# Patient Record
Sex: Female | Born: 1990 | Race: Black or African American | Hispanic: No | Marital: Married | Smoking: Never smoker
Health system: Southern US, Community
[De-identification: ages and names within clinical notes are randomized; demographics above are authoritative.]

## PROBLEM LIST (undated history)

## (undated) DIAGNOSIS — M810 Age-related osteoporosis without current pathological fracture: Secondary | ICD-10-CM

## (undated) DIAGNOSIS — E785 Hyperlipidemia, unspecified: Secondary | ICD-10-CM

## (undated) DIAGNOSIS — J449 Chronic obstructive pulmonary disease, unspecified: Secondary | ICD-10-CM

## (undated) DIAGNOSIS — T7840XA Allergy, unspecified, initial encounter: Secondary | ICD-10-CM

## (undated) DIAGNOSIS — F32A Depression, unspecified: Secondary | ICD-10-CM

## (undated) DIAGNOSIS — Z789 Other specified health status: Secondary | ICD-10-CM

## (undated) DIAGNOSIS — F419 Anxiety disorder, unspecified: Secondary | ICD-10-CM

## (undated) DIAGNOSIS — J45909 Unspecified asthma, uncomplicated: Secondary | ICD-10-CM

## (undated) HISTORY — DX: Unspecified asthma, uncomplicated: J45.909

## (undated) HISTORY — DX: Allergy, unspecified, initial encounter: T78.40XA

## (undated) HISTORY — DX: Chronic obstructive pulmonary disease, unspecified: J44.9

## (undated) HISTORY — DX: Hyperlipidemia, unspecified: E78.5

## (undated) HISTORY — DX: Other specified health status: Z78.9

## (undated) HISTORY — DX: Depression, unspecified: F32.A

## (undated) HISTORY — PX: EYE SURGERY: SHX253

## (undated) HISTORY — PX: TUBAL LIGATION: SHX77

## (undated) HISTORY — DX: Age-related osteoporosis without current pathological fracture: M81.0

## (undated) HISTORY — DX: Anxiety disorder, unspecified: F41.9

---

## 2008-09-20 ENCOUNTER — Ambulatory Visit (HOSPITAL_COMMUNITY): Admission: RE | Admit: 2008-09-20 | Discharge: 2008-09-20 | Payer: Self-pay | Admitting: Obstetrics and Gynecology

## 2010-02-03 ENCOUNTER — Encounter: Payer: Self-pay | Admitting: Obstetrics and Gynecology

## 2018-04-13 HISTORY — PX: ENDOMETRIAL ABLATION: SHX621

## 2020-07-03 ENCOUNTER — Encounter: Payer: Self-pay | Admitting: Medical-Surgical

## 2020-07-03 ENCOUNTER — Ambulatory Visit (INDEPENDENT_AMBULATORY_CARE_PROVIDER_SITE_OTHER): Payer: Non-veteran care | Admitting: Medical-Surgical

## 2020-07-03 ENCOUNTER — Other Ambulatory Visit: Payer: Self-pay

## 2020-07-03 VITALS — BP 122/84 | HR 83 | Temp 99.6°F | Ht 62.5 in | Wt 232.0 lb

## 2020-07-03 DIAGNOSIS — Z1159 Encounter for screening for other viral diseases: Secondary | ICD-10-CM

## 2020-07-03 DIAGNOSIS — Z7689 Persons encountering health services in other specified circumstances: Secondary | ICD-10-CM

## 2020-07-03 DIAGNOSIS — Z114 Encounter for screening for human immunodeficiency virus [HIV]: Secondary | ICD-10-CM

## 2020-07-03 DIAGNOSIS — N644 Mastodynia: Secondary | ICD-10-CM

## 2020-07-03 DIAGNOSIS — Z1329 Encounter for screening for other suspected endocrine disorder: Secondary | ICD-10-CM

## 2020-07-03 DIAGNOSIS — M549 Dorsalgia, unspecified: Secondary | ICD-10-CM

## 2020-07-03 DIAGNOSIS — Z131 Encounter for screening for diabetes mellitus: Secondary | ICD-10-CM

## 2020-07-03 DIAGNOSIS — Z0000 Encounter for general adult medical examination without abnormal findings: Secondary | ICD-10-CM

## 2020-07-03 DIAGNOSIS — F419 Anxiety disorder, unspecified: Secondary | ICD-10-CM

## 2020-07-03 DIAGNOSIS — M542 Cervicalgia: Secondary | ICD-10-CM

## 2020-07-03 DIAGNOSIS — G8929 Other chronic pain: Secondary | ICD-10-CM

## 2020-07-03 MED ORDER — ESCITALOPRAM OXALATE 10 MG PO TABS: ORAL_TABLET | ORAL | 1 refills | Status: DC

## 2020-07-03 NOTE — Progress Notes (Signed)
New Patient Office Visit  Subjective:  Patient ID: Jamie Manning, female    DOB: 08/18/1990  Age: 30 y.o. MRN: [id number]  CC:  Chief Complaint  Patient presents with   Establish Care    HPI Jamie Manning presents to establish care.  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  Anxiety-notes that she does have significant anxiety that gets in the way of completing her everyday activities.  A lot of times, her anxiety revolves around her job.  Most notable trigger involves deviation from plans that she has made.  When unexpected issues do arise, she has difficulty tolerating the change in plan and often ends up crying out her frustration.  She has never been to counseling and has never tried medication before but she is open to both.  Denies SI/HI.  Breast pain-she has dealt with breast pain for several years and notes that her breasts have always been large and very heavy.  She has difficulty finding bras that fit despite several attempts at being sized.  She would like to speak with someone in plastic surgery regarding getting a breast reduction to help reduce her back and neck pain.  Back/neck pain-she does have quite a bit of back and neck pain that is chronic in nature.  Some of this she relates to being a large breasted female.  Other parts may be relatable to having a sedentary job where she sits at a computer all day.  She has considered getting a stand-up desk but has not pursued that yet.  Past Medical History:  Diagnosis Date   No pertinent past medical history     Past Surgical History:  Procedure Laterality Date   TUBAL LIGATION      History reviewed. No pertinent family history.  Social History   Socioeconomic History   Marital status: Married    Spouse name: Not on file   Number of children: Not on file   Years of education: Not on file   Highest education level: Not on file  Occupational History   Not on file  Tobacco Use   Smoking  status: Never   Smokeless tobacco: Never  Vaping Use   Vaping Use: Not on file  Substance and Sexual Activity   Alcohol use: Never   Drug use: Never   Sexual activity: Yes    Birth control/protection: Surgical    Comment: Tubal ligation  Other Topics Concern   Not on file  Social History Narrative   Not on file   Social Determinants of Health   Financial Resource Strain: Not on file  Food Insecurity: Not on file  Transportation Needs: Not on file  Physical Activity: Not on file  Stress: Not on file  Social Connections: Not on file  Intimate Partner Violence: Not on file    ROS Review of Systems  Constitutional:  Negative for chills, fatigue, fever and unexpected weight change.  Respiratory:  Negative for cough, chest tightness, shortness of breath and wheezing.   Cardiovascular:  Negative for chest pain, palpitations and leg swelling.  Gastrointestinal:  Negative for abdominal pain, constipation, diarrhea, nausea and vomiting.  Musculoskeletal:  Positive for back pain and neck pain.  Neurological:  Negative for dizziness, light-headedness and headaches.  Psychiatric/Behavioral:  Negative for dysphoric mood, self-injury, sleep disturbance and suicidal ideas. The patient is nervous/anxious.    Objective:   Today's Vitals: BP 122/84   Pulse 83   Temp 99.6 F (37.6 C)   Ht 5'  2.5" (1.588 m)   Wt 232 lb (105.2 kg)   LMP 06/28/2020   SpO2 98%   BMI 41.76 kg/m   Physical Exam Vitals reviewed.  Constitutional:      General: She is not in acute distress.    Appearance: Normal appearance.  HENT:     Head: Normocephalic and atraumatic.  Cardiovascular:     Rate and Rhythm: Normal rate and regular rhythm.     Pulses: Normal pulses.     Heart sounds: Normal heart sounds. No murmur heard.   No friction rub. No gallop.  Pulmonary:     Effort: Pulmonary effort is normal. No respiratory distress.     Breath sounds: Normal breath sounds. No wheezing.  Skin:    General:  Skin is warm and dry.  Neurological:     Mental Status: She is alert and oriented to person, place, and time.  Psychiatric:        Mood and Affect: Mood normal.        Behavior: Behavior normal.        Thought Content: Thought content normal.        Judgment: Judgment normal.    Assessment & Plan:   1. Encounter to establish care Reviewed available information and discussed healthcare concerns with patient.  2. Need for hepatitis C screening test 3. Screening for HIV (human immunodeficiency virus) Discuss screening recommendations.  Patient is agreeable so adding to blood work today. - Hepatitis C antibody - HIV Antibody (routine testing w rflx)  4. Neck pain 5. Chronic bilateral back pain, unspecified back locationRecommend getting a stand-up desk.  Also reviewed good body mechanics and recommended physical therapy stretches to do to maintain good posture as well as help resolve current back and neck pain.  Okay to use over-the-counter medications such as Tylenol and ibuprofen to help if needed.  6. Preventative health care Checking CBC with differential, CMP, and lipid panel today. - CBC with Differential/Platelet - COMPLETE METABOLIC PANEL WITH GFR - Lipid panel  7. Thyroid disorder screen Checking TSH. - TSH  8. Diabetes mellitus screening Checking hemoglobin A1c.  9. Breast pain Referring to plastic surgery - Ambulatory referral to Plastic Surgery  10. Anxiety Discussed treatment options.  Starting Lexapro 5 mg daily with increase to 10 mg daily after 8 days.  Referring to behavioral health for counseling. - Ambulatory referral to behavioral health    Outpatient Encounter Medications as of 07/03/2020  Medication Sig   Multiple Vitamin tablet Take 1 tablet by mouth daily.   No facility-administered encounter medications on file as of 07/03/2020.    Follow-up: Return in about 4 weeks (around 07/31/2020) for mood follow up.   Thayer Ohm, DNP, APRN,  FNP-BC [facility]

## 2020-07-03 NOTE — Patient Instructions (Signed)
Reasons why it is important to allow yourself to process and experience true feelings: When you numb sadness, you also numb happiness and Jamillia Closson. Struggling with your emotions often leads to more suffering. Processing and experiencing your feelings is a part of having a full life.    Coping skills are things we can do to make ourselves feel better when we are going through difficult times. Examples of coping skills include:  Take a deep breath Count to 20 Listen to music Call a friend Take a walk Read a book Do a puzzle Meditate Journal Exercise Stretch Sing Bake Knit Go outside Garden Pray Color Send a note Take a bath Watch a movie Pet an animal Visit a friend Be alone in a quiet place   When your anxiety gets worse, try these grounding techniques:      If you need additional help, please contact your primary care provider or call one of the resources listed below:  [facility]  24-hour HelpLine at 336-832-9700 or 800-711-2635 [address] [place], [place] [address]  National Hopeline Network 800-SUICIDE or 800-784-2433  The National Suicide Prevention Lifeline 800-273-TALK or 800-273-8255  [facility] https://www.celebraterecovery.com/   

## 2020-07-04 LAB — COMPLETE METABOLIC PANEL WITH GFR
AG Ratio: 1.5 (calc) (ref 1.0–2.5)
ALT: 12 U/L (ref 6–29)
AST: 15 U/L (ref 10–30)
Albumin: 4.3 g/dL (ref 3.6–5.1)
Alkaline phosphatase (APISO): 85 U/L (ref 31–125)
BUN/Creatinine Ratio: 10 (calc) (ref 6–22)
BUN: 6 mg/dL — ABNORMAL LOW (ref 7–25)
CO2: 26 mmol/L (ref 20–32)
Calcium: 9.3 mg/dL (ref 8.6–10.2)
Chloride: 104 mmol/L (ref 98–110)
Creat: 0.61 mg/dL (ref 0.50–1.10)
GFR, Est African American: 140 mL/min/{1.73_m2} (ref >=60)
GFR, Est Non African American: 121 mL/min/{1.73_m2} (ref >=60)
Globulin: 2.8 g/dL (calc) (ref 1.9–3.7)
Glucose, Bld: 81 mg/dL (ref 65–99)
Potassium: 4.1 mmol/L (ref 3.5–5.3)
Sodium: 139 mmol/L (ref 135–146)
Total Bilirubin: 0.5 mg/dL (ref 0.2–1.2)
Total Protein: 7.1 g/dL (ref 6.1–8.1)

## 2020-07-04 LAB — CBC WITH DIFFERENTIAL/PLATELET
Absolute Monocytes: 454 cells/uL (ref 200–950)
Basophils Absolute: 46 cells/uL (ref 0–200)
Basophils Relative: 0.6 %
Eosinophils Absolute: 208 cells/uL (ref 15–500)
Eosinophils Relative: 2.7 %
HCT: 40.4 % (ref 35.0–45.0)
Hemoglobin: 13.5 g/dL (ref 11.7–15.5)
Lymphs Abs: 2495 cells/uL (ref 850–3900)
MCH: 28.7 pg (ref 27.0–33.0)
MCHC: 33.4 g/dL (ref 32.0–36.0)
MCV: 86 fL (ref 80.0–100.0)
MPV: 10.1 fL (ref 7.5–12.5)
Monocytes Relative: 5.9 %
Neutro Abs: 4497 cells/uL (ref 1500–7800)
Neutrophils Relative %: 58.4 %
Platelets: 329 10*3/uL (ref 140–400)
RBC: 4.7 10*6/uL (ref 3.80–5.10)
RDW: 13.1 % (ref 11.0–15.0)
Total Lymphocyte: 32.4 %
WBC: 7.7 10*3/uL (ref 3.8–10.8)

## 2020-07-04 LAB — LIPID PANEL
Cholesterol: 224 mg/dL — ABNORMAL HIGH (ref <200)
HDL: 52 mg/dL (ref >=50)
LDL Cholesterol (Calc): 151 mg/dL (calc) — ABNORMAL HIGH
Non-HDL Cholesterol (Calc): 172 mg/dL (calc) — ABNORMAL HIGH (ref <130)
Total CHOL/HDL Ratio: 4.3 (calc) (ref <5.0)
Triglycerides: 97 mg/dL (ref <150)

## 2020-07-04 LAB — HEPATITIS C ANTIBODY
Hepatitis C Ab: NONREACTIVE
SIGNAL TO CUT-OFF: 0.06 (ref <1.00)

## 2020-07-04 LAB — HIV ANTIBODY (ROUTINE TESTING W REFLEX): HIV 1&2 Ab, 4th Generation: NONREACTIVE

## 2020-07-04 LAB — TSH: TSH: 0.78 mIU/L

## 2020-07-16 ENCOUNTER — Encounter: Payer: Self-pay | Admitting: Medical-Surgical

## 2020-07-17 MED ORDER — ESCITALOPRAM OXALATE 10 MG PO TABS: ORAL_TABLET | ORAL | 1 refills | Status: DC

## 2020-07-31 ENCOUNTER — Ambulatory Visit: Payer: Self-pay | Admitting: Medical-Surgical

## 2020-07-31 DIAGNOSIS — F419 Anxiety disorder, unspecified: Secondary | ICD-10-CM

## 2020-09-06 ENCOUNTER — Encounter: Payer: Self-pay | Admitting: Medical-Surgical

## 2020-09-06 ENCOUNTER — Telehealth (INDEPENDENT_AMBULATORY_CARE_PROVIDER_SITE_OTHER): Payer: BC Managed Care – PPO | Admitting: Medical-Surgical

## 2020-09-06 DIAGNOSIS — F419 Anxiety disorder, unspecified: Secondary | ICD-10-CM

## 2020-09-06 DIAGNOSIS — K644 Residual hemorrhoidal skin tags: Secondary | ICD-10-CM | POA: Diagnosis not present

## 2020-09-06 DIAGNOSIS — K648 Other hemorrhoids: Secondary | ICD-10-CM | POA: Diagnosis not present

## 2020-09-06 MED ORDER — HYDROCORTISONE (PERIANAL) 2.5 % EX CREA: 1.0000 "application " | TOPICAL_CREAM | Freq: Two times a day (BID) | CUTANEOUS | 0 refills | Status: DC

## 2020-09-06 MED ORDER — HYDROCORTISONE ACETATE 25 MG RE SUPP: 25.0000 mg | Freq: Two times a day (BID) | RECTAL | 2 refills | Status: DC | PRN

## 2020-09-06 NOTE — Progress Notes (Signed)
Virtual Visit via Video Note  I connected with Jamie Manning on 09/06/20 at  3:00 PM EDT by a video enabled telemedicine application and verified that I am speaking with the correct person using two identifiers.   I discussed the limitations of evaluation and management by telemedicine and the availability of in person appointments. The patient expressed understanding and agreed to proceed.  Patient location: home Provider locations: office  Subjective:    CC: Mood follow-up  HPI: Pleasant 30 year old female presenting via MyChart video visit for mood follow-up.  In early July, we started on Lexapro 10 mg daily.  She has been taking the medication daily as prescribed, tolerating well for the most part.  She did have some severe nausea during the first couple of weeks but this is resolved with the exception of intermittent mild nausea.  She notes this side effect is very manageable.  She does note some improvement in her mood although she feels like the medication could work slightly better.  Most of her concern revolves around her ability to sleep at night.  She has never taken medications for sleep outside of melatonin one time several years ago.  Denies SI/HI.  Has a history of hemorrhoids and notes that she since she has been working from home and sitting more often, she is having difficulty with hemorrhoid flares.  She has external hemorrhoids and possibly internal hemorrhoids as well.  Has difficulty with this every day.  Notes that her stools are a bit hard to pass and require straining at times.  This can be kind of painful.  She has not seen any bright red blood or black tarry stools.  She has used Tucks pads which do help relieve the irritation but do not address the actual hemorrhoids.  Past medical history, Surgical history, Family history not pertinant except as noted below, Social history, Allergies, and medications have been entered into the medical record, reviewed, and corrections  made.   Review of Systems: See HPI for pertinent positives and negatives.   Depression screen [facility] 2/9 09/06/2020 07/03/2020  Decreased Interest 0 2  Down, Depressed, Hopeless 1 2  PHQ - 2 Score 1 4  Altered sleeping 1 1  Tired, decreased energy 1 1  Change in appetite 1 1  Feeling bad or failure about yourself  0 2  Trouble concentrating 1 2  Moving slowly or fidgety/restless 0 1  Suicidal thoughts 0 0  PHQ-9 Score 5 12  Difficult doing work/chores Somewhat difficult Somewhat difficult   GAD 7 : Generalized Anxiety Score 09/06/2020 07/03/2020  Nervous, Anxious, on Edge 1 3  Control/stop worrying 0 2  Worry too much - different things 1 2  Trouble relaxing 1 2  Restless 1 2  Easily annoyed or irritable 1 1  Afraid - awful might happen 0 0  Total GAD 7 Score 5 12  Anxiety Difficulty Somewhat difficult Somewhat difficult   Objective:    General: Speaking clearly in complete sentences without any shortness of breath.  Alert and oriented x3.  Normal judgment. No apparent acute distress.  Impression and Recommendations:    1. Anxiety After reviewing various options including continue the medication, add a medication to it, or switch to a different medication, she would like to continue her current dose of Lexapro 10 mg daily.  Discussed over-the-counter and herbal options for helping with anxiety and sleep.  Recommend ashwaghanda, kava, valerian root, chamomile, or sleepy time tea to help with anxiety/sleep at bedtime.  She  will reach out to me should these alternatives not work and she decides she would like to try different medicine.  2. Internal and external hemorrhoids without complication Sending in Anusol suppositories to use for internal irritation.  Also sending in Anusol cream to use for external.  Discussed at length treatments available for constipation.  Recommend adding in a fiber supplement as well as using MiraLAX daily as needed.  I discussed the assessment and  treatment plan with the patient. The patient was provided an opportunity to ask questions and all were answered. The patient agreed with the plan and demonstrated an understanding of the instructions.   The patient was advised to call back or seek an in-person evaluation if the symptoms worsen or if the condition fails to improve as anticipated.  20 minutes of non-face-to-face time was provided during this encounter.  Return in about 3 months (around 12/07/2020) for mood follow up.  Thayer Ohm, DNP, APRN, FNP-BC [facility] Medicine

## 2020-10-10 ENCOUNTER — Institutional Professional Consult (permissible substitution): Payer: Self-pay | Admitting: Plastic Surgery

## 2021-01-30 ENCOUNTER — Institutional Professional Consult (permissible substitution): Payer: Non-veteran care | Admitting: Plastic Surgery

## 2021-01-31 ENCOUNTER — Telehealth: Payer: Self-pay | Admitting: General Practice

## 2021-01-31 NOTE — Telephone Encounter (Signed)
Transition Care Management Follow-up Telephone Call Date of discharge and from where: 01/31/21 from [facility] How have you been since you were released from the hospital? Still having a lot of hip pain. Any questions or concerns? No  Items Reviewed: Did the pt receive and understand the discharge instructions provided? Yes  Medications obtained and verified? Yes  Other? No  Any new allergies since your discharge? No  Dietary orders reviewed? Yes Do you have support at home? Yes   Home Care and Equipment/Supplies: Were home health services ordered? no  Functional Questionnaire: (I = Independent and D = Dependent) ADLs: I  Bathing/Dressing- I  Meal Prep- I  Eating- I  Maintaining continence- I  Transferring/Ambulation- I  Managing Meds- I  Follow up appointments reviewed:  PCP Hospital f/u appt confirmed? Yes  Scheduled to see Christen Butter, NP on 02/05/21 @ 1600. Specialist Hospital f/u appt confirmed? No   Are transportation arrangements needed? No  If their condition worsens, is the pt aware to call PCP or go to the Emergency Dept.? Yes Was the patient provided with contact information for the PCP's office or ED? Yes Was to pt encouraged to call back with questions or concerns? Yes

## 2021-02-03 ENCOUNTER — Ambulatory Visit: Payer: Non-veteran care | Admitting: Medical-Surgical

## 2021-02-05 ENCOUNTER — Ambulatory Visit (INDEPENDENT_AMBULATORY_CARE_PROVIDER_SITE_OTHER): Payer: Self-pay | Admitting: Medical-Surgical

## 2021-02-05 ENCOUNTER — Other Ambulatory Visit: Payer: Self-pay

## 2021-02-05 ENCOUNTER — Encounter: Payer: Self-pay | Admitting: Medical-Surgical

## 2021-02-05 VITALS — BP 126/85 | HR 79 | Resp 20 | Ht 62.5 in | Wt 226.0 lb

## 2021-02-05 DIAGNOSIS — T148XXA Other injury of unspecified body region, initial encounter: Secondary | ICD-10-CM

## 2021-02-05 DIAGNOSIS — Z09 Encounter for follow-up examination after completed treatment for conditions other than malignant neoplasm: Secondary | ICD-10-CM
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
## 2021-02-07 ENCOUNTER — Institutional Professional Consult (permissible substitution): Payer: Non-veteran care | Admitting: Plastic Surgery

## 2021-02-16 ENCOUNTER — Encounter: Payer: Self-pay | Admitting: Medical-Surgical

## 2021-02-19 ENCOUNTER — Ambulatory Visit: Payer: Non-veteran care | Admitting: Medical-Surgical

## 2021-02-19 ENCOUNTER — Ambulatory Visit: Payer: BC Managed Care – PPO | Attending: Medical-Surgical | Admitting: Physical Therapy

## 2021-02-19 ENCOUNTER — Other Ambulatory Visit: Payer: Self-pay

## 2021-02-19 ENCOUNTER — Encounter: Payer: Self-pay | Admitting: Physical Therapy

## 2021-02-19 DIAGNOSIS — R29898 Other symptoms and signs involving the musculoskeletal system: Secondary | ICD-10-CM

## 2021-02-19 DIAGNOSIS — M791 Myalgia, unspecified site: Secondary | ICD-10-CM

## 2021-02-19 DIAGNOSIS — M6281 Muscle weakness (generalized): Secondary | ICD-10-CM | POA: Diagnosis present

## 2021-02-19 DIAGNOSIS — T148XXA Other injury of unspecified body region, initial encounter: Secondary | ICD-10-CM | POA: Diagnosis not present
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
## 2021-02-19 NOTE — Patient Instructions (Signed)
Access Code: [email or web address] URL: [email or web address] Date: 02/19/2021 Prepared by: Reggy Eye  Exercises Supine Lower Trunk Rotation - 1 x daily - 7 x weekly - 3 sets - 10 reps Supine Transversus Abdominis Bracing - Hands on Stomach - 1 x daily - 7 x weekly - 1 sets - 10 reps - 5 seconds hold Bent Knee Fallouts - 1 x daily - 7 x weekly - 2 sets - 10 reps Seated Scapular Retraction - 1 x daily - 7 x weekly - 3 sets - 10 reps Shoulder External Rotation and Scapular Retraction with Resistance - 1 x daily - 7 x weekly - 3 sets - 10 reps

## 2021-02-24 ENCOUNTER — Ambulatory Visit (INDEPENDENT_AMBULATORY_CARE_PROVIDER_SITE_OTHER): Payer: BC Managed Care – PPO | Admitting: Plastic Surgery

## 2021-02-24 ENCOUNTER — Other Ambulatory Visit: Payer: Self-pay

## 2021-02-24 ENCOUNTER — Encounter: Payer: Self-pay | Admitting: Plastic Surgery

## 2021-02-24 VITALS — BP 122/83 | HR 81 | Ht 61.0 in | Wt 228.4 lb

## 2021-02-24 DIAGNOSIS — M793 Panniculitis, unspecified: Secondary | ICD-10-CM | POA: Diagnosis not present

## 2021-02-24 DIAGNOSIS — G8929 Other chronic pain: Secondary | ICD-10-CM | POA: Diagnosis not present

## 2021-02-24 DIAGNOSIS — N62 Hypertrophy of breast: Secondary | ICD-10-CM | POA: Diagnosis not present

## 2021-02-24 DIAGNOSIS — M546 Pain in thoracic spine: Secondary | ICD-10-CM | POA: Diagnosis not present

## 2021-02-24 NOTE — Progress Notes (Signed)
Referring Provider Christen Butter, NP [address] [place],  [place] [address]   CC:  Breast hypertrophy, abdominal rashes  Jamie Manning is an 31 y.o. female.  HPI: Mammary Hyperplasia: The patient is a 31 y.o. female with a history of mammary hyperplasia for several years.  She has extremely large breasts causing symptoms that include the following: Back pain in the upper and lower back, including neck pain. She pulls or pins her bra straps to provide better lift and relief of the pressure and pain. She notices relief by holding her breast up manually.  Her shoulder straps cause grooves and pain and pressure that requires padding for relief. Pain medication is sometimes required with motrin and tylenol.  Activities that are hindered by enlarged breasts include: exercise and running.  She has tried supportive clothing as well as fitted bras without improvement.  Her breasts are extremely large and fairly symmetric with the left slightly larger.  She has hyperpigmentation of the inframammary area on both sides.    Preoperative bra size = 42 DDD cup.  Mammogram history: None due to age.  Family history of breast cancer: Paternal grandmother.  Tobacco use: None.   The patient expresses the desire to pursue surgical intervention.   Patient is also interested in abdominal panniculectomy.  She has been using powders and deodorant and other over-the-counter medication for treatment of her rashes.  Of note she had a C-section in 2019 and a tubal ligation in 2019.  Allergies  Allergen Reactions   Penicillins Anaphylaxis, Shortness Of Breath, Itching, Swelling and Cough    Outpatient Encounter Medications as of 02/24/2021  Medication Sig   escitalopram (LEXAPRO) 10 MG tablet Take 5mg  daily for 8 days then  take 10mg  daily.   Multiple Vitamin tablet Take 1 tablet by mouth daily.   [DISCONTINUED] hydrocortisone (ANUSOL-HC) 2.5 % rectal cream Place 1 application rectally 2 (two) times  daily. (Patient not taking: Reported on 02/24/2021)   [DISCONTINUED] hydrocortisone (ANUSOL-HC) 25 MG suppository Place 1 suppository (25 mg total) rectally 2 (two) times daily as needed for hemorrhoids. (Patient not taking: Reported on 02/24/2021)   No facility-administered encounter medications on file as of 02/24/2021.     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18 40.65 -  Systolic 122 126 (No Data)  Diastolic 83 85 (No Data)  Pulse 81 79 -    General:  No acute distress,  Alert and oriented, Non-Toxic,  Normal speech and affect Breast: No masses palpable on physical exam, significant breast ptosis, macromastia the sternal to nipple distance on the right is 33 cm and the left is 35 cm.  The IMF distance is 14 on the right and 15 on the left cm.   Abdomen: Significant skin and adipose excess above and below the umbilicus.  Low intra-abdominal fat  Assessment/Plan  Breast: The patient has bilateral symptomatic macromastia.  She is a good candidate for a breast reduction.  She is interested in pursuing surgical treatment.  She has tried supportive garments and fitted  bras with no relief.  The details of breast reduction surgery were discussed.  I explained the procedure in detail along the with the expected scars.  The risks were discussed in detail and include bleeding, infection, damage to surrounding structures, need for additional procedures, nipple loss, change in nipple sensation, persistent pain, contour irregularities and asymmetries.  I explained that breast feeding is often not possible after breast reduction surgery.  We discussed the expected postoperative course with an overall recovery period of about 1 month.  She demonstrated full understanding of all risks.  We discussed her personal risk factors that include high BMI.  The patient is interested in pursuing surgical treatment.  I anticipate approximately 750 to 800 g of tissue removed from each side.   Abdomen: The patient is a candidate for abdominal panniculectomy which may improve her rashes.  And then her best result would be to lose additional weight and we could consider full abdominoplasty at that time.  She will get maximal benefit since she does have excess above and below her umbilicus.  [name] 02/24/2021, 3:44 PM

## 2021-02-25 ENCOUNTER — Ambulatory Visit: Payer: BC Managed Care – PPO | Admitting: Physical Therapy

## 2021-02-28 ENCOUNTER — Ambulatory Visit: Payer: BC Managed Care – PPO | Admitting: Physical Therapy

## 2021-02-28 ENCOUNTER — Other Ambulatory Visit: Payer: Self-pay

## 2021-02-28 DIAGNOSIS — M6281 Muscle weakness (generalized): Secondary | ICD-10-CM | POA: Diagnosis not present

## 2021-02-28 DIAGNOSIS — R29898 Other symptoms and signs involving the musculoskeletal system: Secondary | ICD-10-CM

## 2021-02-28 DIAGNOSIS — M791 Myalgia, unspecified site: Secondary | ICD-10-CM

## 2021-02-28 NOTE — Therapy (Signed)
[facility] [address] [place], [place], [address] Phone: [phone]   Fax:  [phone]  Physical Therapy Treatment  Patient Details  Name: Jamie Manning MRN: [id number] Date of Birth: 08/24/1990 Referring Provider (PT): Christen Butter   Encounter Date: 02/28/2021   PT End of Session - 02/28/21 0935     Visit Number 2    Number of Visits 12    Date for PT Re-Evaluation 04/02/21    PT Start Time 0851    PT Stop Time 0930    PT Time Calculation (min) 39 min    Activity Tolerance Patient tolerated treatment well    Behavior During Therapy [facility] for tasks assessed/performed             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   Subjective Assessment - 02/28/21 0856     Subjective Pt states her back and shoulder are still sore. She feels her shoulder flairs up when she lifts.    Currently in Pain? Yes    Pain Score 5     Pain Location Shoulder    Pain Orientation Right    Pain Descriptors / Indicators Aching;Sore    Pain Type Acute pain                               [facility] Adult PT Treatment/Exercise - 02/28/21 0001       Lumbar Exercises: Aerobic   Nustep L5 x 5 min for warm up      Lumbar Exercises: Supine   Ab Set 10 reps;5 seconds    AB Set Limitations then ab set with bent knee fall out x 10    Other Supine Lumbar Exercises lower trunk rotation x 10 bilat      Shoulder Exercises: Seated   Retraction Limitations bilat retraction x 10 red TB    Other Seated Exercises scap squeeze x 10 cues for posture      Shoulder Exercises:  Standing   Extension 15 reps    Theraband Level (Shoulder Extension) Level 2 (Red)    Row 15 reps    Theraband Level (Shoulder Row) Level 2 (Red)    Other Standing Exercises bow and arrow red TB x 10 bilat      Manual Therapy   Manual Therapy Joint mobilization;Soft tissue mobilization    Joint Mobilization SIJ CPAs and UPAs grade 2-3    Soft tissue mobilization STM Rt glue and piriformis                          PT Long Term Goals - 02/19/21 1119       PT LONG TERM GOAL #1   Title Pt will be independent in HEP    Time 6    Period Weeks    Status New    Target Date 04/02/21      PT LONG TERM GOAL #2   Title Pt will improve  FOTO to >= 73 to demo improved functional mobility    Time 6    Period Weeks    Status New    Target Date 04/02/21      PT LONG TERM GOAL #3   Title Pt will improve bilat LE strength to 4+/5 to tolerate walking x 20 minutes with decreased pain    Time 6    Period Weeks    Status New    Target Date 04/02/21      PT LONG TERM GOAL #4   Title Pt will tolerate carrying in groceries with Rt UE with pain <= 1/10    Time 6    Period Weeks    Status New    Target Date 04/02/21                   Plan - 02/28/21 0935     Clinical Impression Statement Pt continues with Rt sided back and Rt shoulder pain. Good tolerance to progression of exercises. Able to decrease pain by end of session with manual work    PT Next Visit Plan update HEP, progress core and postural strength    PT Home Exercise Plan [email or web address]    Consulted and Agree with Plan of Care Patient             Patient will benefit from skilled therapeutic intervention in order to improve the following deficits and impairments:     Visit Diagnosis: Muscle weakness (generalized)  Myalgia  Other symptoms and signs involving the musculoskeletal system     Problem List There are no problems to display for this patient.   Tashay Bozich, PT 02/28/2021, 9:36  AM  [facility] [address] [place], [place], [address] Phone: [phone]   Fax:  [phone]  Name: Jamie Manning MRN: [id number] Date of Birth: 09/06/1990

## 2021-03-04 ENCOUNTER — Encounter: Payer: Self-pay | Admitting: Physical Therapy

## 2021-03-07 ENCOUNTER — Other Ambulatory Visit: Payer: Self-pay

## 2021-03-07 ENCOUNTER — Ambulatory Visit: Payer: BC Managed Care – PPO | Admitting: Physical Therapy

## 2021-03-07 DIAGNOSIS — M6281 Muscle weakness (generalized): Secondary | ICD-10-CM

## 2021-03-07 DIAGNOSIS — M791 Myalgia, unspecified site: Secondary | ICD-10-CM

## 2021-03-07 DIAGNOSIS — R29898 Other symptoms and signs involving the musculoskeletal system: Secondary | ICD-10-CM
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
## 2021-03-12 ENCOUNTER — Ambulatory Visit: Payer: BC Managed Care – PPO | Admitting: Physical Therapy

## 2021-03-12 ENCOUNTER — Telehealth: Payer: Self-pay | Admitting: Medical-Surgical

## 2021-03-12 NOTE — Telephone Encounter (Signed)
Short term disability forms received. Patient message noted saying she will be out a few more weeks. She was originally written out of work until 03/11/2021 with intermittent absences after that to attend appointments and such. If she is needing a few more weeks, this requires a follow up appointment to discuss the appropriateness of extending her time out. I have to be able to justify keeping her out of work as there are often accommodations that can be made. Please contact patient regarding this and schedule her for a work in appointment so we can clarify this.  ? ?___________________________________________ ?Thayer Ohm, DNP, APRN, FNP-BC ?Primary Care and Sports Medicine ?[facility] ? ?

## 2021-03-12 NOTE — Telephone Encounter (Signed)
Left message advising of recommendations.  

## 2021-03-13 ENCOUNTER — Other Ambulatory Visit: Payer: Self-pay

## 2021-03-13 ENCOUNTER — Ambulatory Visit: Payer: BC Managed Care – PPO | Attending: Medical-Surgical | Admitting: Physical Therapy

## 2021-03-13 DIAGNOSIS — R29898 Other symptoms and signs involving the musculoskeletal system: Secondary | ICD-10-CM | POA: Insufficient documentation

## 2021-03-13 DIAGNOSIS — M791 Myalgia, unspecified site: Secondary | ICD-10-CM | POA: Insufficient documentation

## 2021-03-13 DIAGNOSIS — M6281 Muscle weakness (generalized): Secondary | ICD-10-CM | POA: Diagnosis present
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
## 2021-03-19 ENCOUNTER — Other Ambulatory Visit: Payer: Self-pay

## 2021-03-19 ENCOUNTER — Ambulatory Visit: Payer: BC Managed Care – PPO | Admitting: Physical Therapy

## 2021-03-19 ENCOUNTER — Telehealth: Payer: Self-pay | Admitting: Medical-Surgical

## 2021-03-19 DIAGNOSIS — M6281 Muscle weakness (generalized): Secondary | ICD-10-CM | POA: Diagnosis not present

## 2021-03-19 DIAGNOSIS — R29898 Other symptoms and signs involving the musculoskeletal system: Secondary | ICD-10-CM

## 2021-03-19 DIAGNOSIS — M791 Myalgia, unspecified site: Secondary | ICD-10-CM

## 2021-03-19 NOTE — Telephone Encounter (Signed)
Patient dropped off Short Term Disability forms for PCP ([place]) to complete. Paperwork placed in providers box. Patient was informed of a possible fee and a 3-5 day turnaround. - lmr. ?

## 2021-03-19 NOTE — Telephone Encounter (Signed)
Thank you :)

## 2021-03-19 NOTE — Telephone Encounter (Signed)
Yes ma'am. If that is what needs to be done.
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
## 2021-03-19 NOTE — Telephone Encounter (Signed)
Joy, ?Would you allow me to schedule her in a same day spot? I know your schedule is packed and I noticed your vacation is coming up real soon! ?

## 2021-03-19 NOTE — Telephone Encounter (Signed)
I left patient a v.mail to return my call in regards to scheduling a visit to review paperwork. ?

## 2021-03-20 NOTE — Telephone Encounter (Signed)
Patient has been scheduled for 03/24/2021. ?

## 2021-03-24 ENCOUNTER — Encounter: Payer: Self-pay | Admitting: Medical-Surgical

## 2021-03-24 ENCOUNTER — Ambulatory Visit (INDEPENDENT_AMBULATORY_CARE_PROVIDER_SITE_OTHER): Payer: BC Managed Care – PPO | Admitting: Medical-Surgical

## 2021-03-24 ENCOUNTER — Other Ambulatory Visit: Payer: Self-pay

## 2021-03-24 ENCOUNTER — Ambulatory Visit (INDEPENDENT_AMBULATORY_CARE_PROVIDER_SITE_OTHER): Payer: BC Managed Care – PPO

## 2021-03-24 VITALS — BP 128/84 | HR 79 | Resp 20 | Ht 61.0 in | Wt 236.5 lb

## 2021-03-24 DIAGNOSIS — M542 Cervicalgia: Secondary | ICD-10-CM

## 2021-03-24 DIAGNOSIS — Z0289 Encounter for other administrative examinations: Secondary | ICD-10-CM

## 2021-03-24 DIAGNOSIS — R29898 Other symptoms and signs involving the musculoskeletal system: Secondary | ICD-10-CM | POA: Diagnosis not present

## 2021-03-24 DIAGNOSIS — R1011 Right upper quadrant pain: Secondary | ICD-10-CM

## 2021-03-24 DIAGNOSIS — R42 Dizziness and giddiness: Secondary | ICD-10-CM

## 2021-03-24 DIAGNOSIS — R202 Paresthesia of skin: Secondary | ICD-10-CM | POA: Diagnosis not present
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
## 2021-03-25 LAB — COMPLETE METABOLIC PANEL WITH GFR
AG Ratio: 1.4 (calc) (ref 1.0–2.5)
ALT: 11 U/L (ref 6–29)
AST: 14 U/L (ref 10–30)
Albumin: 4.1 g/dL (ref 3.6–5.1)
Alkaline phosphatase (APISO): 71 U/L (ref 31–125)
BUN/Creatinine Ratio: 10 (calc) (ref 6–22)
BUN: 6 mg/dL — ABNORMAL LOW (ref 7–25)
CO2: 28 mmol/L (ref 20–32)
Calcium: 9 mg/dL (ref 8.6–10.2)
Chloride: 104 mmol/L (ref 98–110)
Creat: 0.58 mg/dL (ref 0.50–0.97)
Globulin: 2.9 g/dL (calc) (ref 1.9–3.7)
Glucose, Bld: 74 mg/dL (ref 65–99)
Potassium: 4.4 mmol/L (ref 3.5–5.3)
Sodium: 139 mmol/L (ref 135–146)
Total Bilirubin: 0.5 mg/dL (ref 0.2–1.2)
Total Protein: 7 g/dL (ref 6.1–8.1)
eGFR: 123 mL/min/{1.73_m2} (ref >=60)

## 2021-03-25 LAB — TSH: TSH: 1.27 mIU/L

## 2021-03-25 LAB — CBC WITH DIFFERENTIAL/PLATELET
Absolute Monocytes: 609 cells/uL (ref 200–950)
Basophils Absolute: 44 cells/uL (ref 0–200)
Basophils Relative: 0.5 %
Eosinophils Absolute: 148 cells/uL (ref 15–500)
Eosinophils Relative: 1.7 %
HCT: 38.8 % (ref 35.0–45.0)
Hemoglobin: 12.7 g/dL (ref 11.7–15.5)
Lymphs Abs: 2445 cells/uL (ref 850–3900)
MCH: 28.4 pg (ref 27.0–33.0)
MCHC: 32.7 g/dL (ref 32.0–36.0)
MCV: 86.8 fL (ref 80.0–100.0)
MPV: 10.3 fL (ref 7.5–12.5)
Monocytes Relative: 7 %
Neutro Abs: 5455 cells/uL (ref 1500–7800)
Neutrophils Relative %: 62.7 %
Platelets: 322 10*3/uL (ref 140–400)
RBC: 4.47 10*6/uL (ref 3.80–5.10)
RDW: 13.7 % (ref 11.0–15.0)
Total Lymphocyte: 28.1 %
WBC: 8.7 10*3/uL (ref 3.8–10.8)

## 2021-03-25 LAB — LIPASE: Lipase: 14 U/L (ref 7–60)

## 2021-03-25 LAB — HEMOGLOBIN A1C
Hgb A1c MFr Bld: 5.4 % of total Hgb (ref <5.7)
Mean Plasma Glucose: 108 mg/dL
eAG (mmol/L): 6 mmol/L

## 2021-03-25 LAB — AMYLASE: Amylase: 41 U/L (ref 21–101)

## 2021-03-27 ENCOUNTER — Ambulatory Visit: Payer: BC Managed Care – PPO | Admitting: Physical Therapy

## 2021-03-31 ENCOUNTER — Ambulatory Visit: Payer: BC Managed Care – PPO | Admitting: Physical Therapy

## 2021-03-31 ENCOUNTER — Other Ambulatory Visit: Payer: Self-pay

## 2021-03-31 ENCOUNTER — Encounter: Payer: Self-pay | Admitting: Physical Therapy

## 2021-03-31 ENCOUNTER — Ambulatory Visit (INDEPENDENT_AMBULATORY_CARE_PROVIDER_SITE_OTHER): Payer: BC Managed Care – PPO

## 2021-03-31 DIAGNOSIS — M791 Myalgia, unspecified site: Secondary | ICD-10-CM

## 2021-03-31 DIAGNOSIS — M6281 Muscle weakness (generalized): Secondary | ICD-10-CM

## 2021-03-31 DIAGNOSIS — R1011 Right upper quadrant pain: Secondary | ICD-10-CM | POA: Diagnosis not present

## 2021-03-31 DIAGNOSIS — R29898 Other symptoms and signs involving the musculoskeletal system: Secondary | ICD-10-CM

## 2021-03-31 NOTE — Therapy (Signed)
[place] ?[facility] ?[address] ?[place], [place], [address] ?Phone: [phone]   Fax:  [phone] ? ?Physical Therapy Treatment ? ?Patient Details  ?Name: Jamie Manning ?MRN: [id number] ?Date of Birth: [birth year] ?Referring Provider (PT): Christen Butter ? ? ?Encounter Date: 03/31/2021 ? ? PT End of Session - 03/31/21 0930   ? ? Visit Number 6   ? Number of Visits 12   ? Date for PT Re-Evaluation 04/02/21   ? PT Start Time 0930   ? PT Stop Time 1014   ? PT Time Calculation (min) 44 min   ? Activity Tolerance Patient tolerated treatment well   ? Behavior During Therapy [facility] for tasks assessed/performed   ? ?  ?  ? ?  ? ? ?Past Medical History:  ?Diagnosis Date  ? Allergy   ? Anxiety 2020  ? COPD (chronic obstructive pulmonary disease) (HCC)   ? No pertinent past medical history   ? Osteoporosis   ? ? ?Past Surgical History:  ?Procedure Laterality Date  ? CESAREAN SECTION  11/26/2017  ? Due to a chorioangioma  ? EYE SURGERY    ? Scar tissue removal  ? TUBAL LIGATION    ? ? ?There were no vitals filed for this visit. ? ? Subjective Assessment - 03/31/21 0930   ? ? Subjective Doing pretty well. About a 4/10 in the right shoulder today.   ? Currently in Pain? Yes   ? Pain Score 4    ? Pain Location Shoulder   ? Pain Orientation Right   ? ?  ?  ? ?  ? ? ? ? ? ? ? ? ? ? ? ? ? ? ? ? ? ? ? ? [facility] Adult PT Treatment/Exercise - 03/31/21 0001   ? ?  ? Lumbar Exercises: Sidelying  ? Other Sidelying Lumbar Exercises mod side plank 5 sec x 2   ?  ? Lumbar Exercises: Prone  ? Straight Leg Raise 10 reps   ? Straight Leg Raises Limitations bil;   ? Other Prone Lumbar Exercises bil leg raise x 10   ? Other Prone Lumbar Exercises plank 1 x 5 sec   ?  ? Shoulder Exercises: Prone  ? Horizontal ABduction 1 Right;10 reps   ? Horizontal ABduction 1 Limitations T   ? Horizontal ABduction 2 Right;10 reps   ? Horizontal ABduction 2 Limitations Y   ?  ? Shoulder Exercises: Standing  ? Horizontal  ABduction --   ? Theraband Level (Shoulder Horizontal ABduction) --   ? Horizontal ABduction Limitations --   ? Flexion 20 reps;Theraband;Weights   ? Theraband Level (Shoulder Flexion) Level 3 (Green)   ? Shoulder Flexion Weight (lbs) 2   ? Flexion Limitations 10 each; less discomfort with wt   ? ABduction 20 reps   ? Shoulder ABduction Weight (lbs) 2   ? Extension Strengthening;Both;20 reps;Theraband   ? Theraband Level (Shoulder Extension) Level 3 (Green)   ? Row 20 reps   ? Theraband Level (Shoulder Row) Level 3 (Green)   ? Other Standing Exercises 2# wt against pool noodle: "L" x20, "W" x20, "T" x20; 7# lawn mower 2x10   ? Other Standing Exercises scaption 2# x 20   ? ?  ?  ? ?  ? ? ? ? ? ? ? ? ? ? ? ? ? ? ? PT Long Term Goals - 03/31/21 0932   ? ?  ? PT LONG TERM GOAL #1  ? Title  Pt will be independent in HEP   ? Status On-going   ?  ? PT LONG TERM GOAL #3  ? Title Pt will improve bilat LE strength to 4+/5 to tolerate walking x 20 minutes with decreased pain   ? Baseline couple blocks until pain flares up   ? Status On-going   ?  ? PT LONG TERM GOAL #4  ? Title Pt will tolerate carrying in groceries with Rt UE with pain <= 1/10   ? Baseline still painful   ? Status On-going   ? ?  ?  ? ?  ? ? ? ? ? ? ? ? Plan - 03/31/21 1006   ? ? Clinical Impression Statement Good tolerance to TE today. Responded well to increased resistance without c/o pain.   ? PT Frequency 2x / week   ? PT Duration 6 weeks   ? PT Treatment/Interventions Aquatic Therapy;Electrical Stimulation;Iontophoresis 4mg /ml Dexamethasone;Moist Heat;Cryotherapy;Neuromuscular re-education;Balance training;Therapeutic exercise;Therapeutic activities;Patient/family education;Manual techniques;Passive range of motion;Dry needling;Taping;Vasopneumatic Device   ? PT Next Visit Plan progress core and postural strength   ? PT Home Exercise Plan [email or web address]   ? Consulted and Agree with Plan of Care Patient   ? ?  ?  ? ?  ? ? ?Patient will benefit from skilled  therapeutic intervention in order to improve the following deficits and impairments:  Pain, Impaired UE functional use, Decreased strength, Decreased activity tolerance, Difficulty walking, Increased muscle spasms, Decreased range of motion ? ?Visit Diagnosis: ?Muscle weakness (generalized) ? ?Myalgia ? ?Other symptoms and signs involving the musculoskeletal system ? ? ? ? ?Problem List ?There are no problems to display for this patient. ? ? , PT ?03/31/2021, 1:16 PM ? ?[place] ?[facility] ?[address] ?[place], [place], [place] ?Phone: [phone]   Fax:  [phone] ? ?Name: Jamie Manning ?MRN: Pricilla Handler ?Date of Birth: [birth year] ? ? ? ?

## 2021-04-03 ENCOUNTER — Other Ambulatory Visit: Payer: Self-pay

## 2021-04-03 ENCOUNTER — Telehealth: Payer: Self-pay

## 2021-04-03 DIAGNOSIS — N62 Hypertrophy of breast: Secondary | ICD-10-CM

## 2021-04-03 DIAGNOSIS — G8929 Other chronic pain: Secondary | ICD-10-CM
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
## 2021-04-04 ENCOUNTER — Encounter: Admitting: Physical Therapy

## 2021-04-08 ENCOUNTER — Ambulatory Visit: Payer: BC Managed Care – PPO | Admitting: Physical Therapy

## 2021-04-08 ENCOUNTER — Other Ambulatory Visit: Payer: Self-pay

## 2021-04-08 DIAGNOSIS — M791 Myalgia, unspecified site: Secondary | ICD-10-CM

## 2021-04-08 DIAGNOSIS — M6281 Muscle weakness (generalized): Secondary | ICD-10-CM | POA: Diagnosis not present

## 2021-04-08 DIAGNOSIS — R29898 Other symptoms and signs involving the musculoskeletal system: Secondary | ICD-10-CM
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
## 2021-04-09 NOTE — Telephone Encounter (Signed)
Forms completed and given to the front desk to contact patient for payment. Copy to scan and to hold.  ?

## 2021-04-11 ENCOUNTER — Ambulatory Visit: Payer: BC Managed Care – PPO | Attending: Plastic Surgery | Admitting: Rehabilitative and Restorative Service Providers"

## 2021-04-11 ENCOUNTER — Encounter: Payer: Self-pay | Admitting: Rehabilitative and Restorative Service Providers"

## 2021-04-11 ENCOUNTER — Ambulatory Visit: Payer: BC Managed Care – PPO | Admitting: Rehabilitative and Restorative Service Providers"

## 2021-04-11 DIAGNOSIS — G8929 Other chronic pain: Secondary | ICD-10-CM

## 2021-04-11 DIAGNOSIS — R29898 Other symptoms and signs involving the musculoskeletal system: Secondary | ICD-10-CM

## 2021-04-11 DIAGNOSIS — R293 Abnormal posture: Secondary | ICD-10-CM

## 2021-04-11 DIAGNOSIS — M6281 Muscle weakness (generalized): Secondary | ICD-10-CM | POA: Diagnosis present

## 2021-04-11 DIAGNOSIS — M546 Pain in thoracic spine: Secondary | ICD-10-CM | POA: Insufficient documentation

## 2021-04-11 DIAGNOSIS — N62 Hypertrophy of breast: Secondary | ICD-10-CM | POA: Insufficient documentation

## 2021-04-11 NOTE — Patient Instructions (Signed)
Access Code: [email or web address] ?URL: [email or web address] ?Date: 04/11/2021 ?Prepared by: Corlis Leak ? ?Exercises ?- Doorway Pec Stretch at 60 Degrees Abduction  - 3 x daily - 7 x weekly - 1 sets - 3 reps ?- Doorway Pec Stretch at 90 Degrees Abduction  - 3 x daily - 7 x weekly - 1 sets - 3 reps - 30 seconds  hold ?- Doorway Pec Stretch at 120 Degrees Abduction  - 3 x daily - 7 x weekly - 1 sets - 3 reps - 30 second hold  hold ?- Seated Cervical Retraction  - 3 x daily - 7 x weekly - 1 sets - 10 reps ?- Standing Scapular Retraction  - 3 x daily - 7 x weekly - 1 sets - 10 reps - 10 hold ?- Shoulder External Rotation and Scapular Retraction  - 3 x daily - 7 x weekly - 1 sets - 10 reps -   hold ?- Shoulder External Rotation in 45 Degrees Abduction  - 2 x daily - 7 x weekly - 1-2 sets - 10 reps - 30 sec  hold ? ?Patient Education ?- Office Posture ?

## 2021-04-11 NOTE — Therapy (Signed)
[place] ?[facility] ?[address] ?[place], [place], [address] ?Phone: [phone]   Fax:  [phone] ? ?Physical Therapy Evaluation ? ?Patient Details  ?Name: Pricilla HandlerDelores Stangeland ?MRN: [id number] ?Date of Birth: October 05, 1990 ?Referring Provider (PT): Dr Janne Napoleonaniel Luppens ? ? ?Encounter Date: 04/11/2021 ? ? PT End of Session - 04/11/21 0927   ? ? Visit Number 1   ? Number of Visits 8   ? Date for PT Re-Evaluation 06/06/21   ? PT Start Time [phone]   ? PT Stop Time 0930   ? PT Time Calculation (min) 41 min   ? Activity Tolerance Patient tolerated treatment well   ? ?  ?  ? ?  ? ? ?Past Medical History:  ?Diagnosis Date  ? Allergy   ? Anxiety 2020  ? COPD (chronic obstructive pulmonary disease) (HCC)   ? No pertinent past medical history   ? Osteoporosis   ? ? ?Past Surgical History:  ?Procedure Laterality Date  ? CESAREAN SECTION  11/26/2017  ? Due to a chorioangioma  ? EYE SURGERY    ? Scar tissue removal  ? TUBAL LIGATION    ? ? ?There were no vitals filed for this visit. ? ? ? Subjective Assessment - 04/11/21 0853   ? ? Subjective Patient reports that she has constant midback pain for the past 8-9 years with increased symptoms after the birth of her daughter in 2019. Symptoms flare at times wth lifting and carrying as well as bending and picking things up from the floor.   ? Limitations Lifting;House hold activities   ? How long can you sit comfortably? 5-10 minutes   ? How long can you stand comfortably? 5-10 minutes   ? How long can you walk comfortably? 15 minutes   ? Patient Stated Goals decrease pain in pain midback and get stronger through midback   ? Currently in Pain? Yes   ? Pain Score 4    ? Pain Location Thoracic   ? Pain Orientation Right;Left;Posterior   ? Pain Descriptors / Indicators Aching;Nagging;Burning;Sharp   sharp and burning during flare up  ? Pain Type Chronic pain   ? Pain Radiating Towards up into neck and down into LB at times   ? Pain Onset More than a  month ago   ? Pain Frequency Constant   ? Aggravating Factors  prolonged positions; lifting; bending; carrying   ? Pain Relieving Factors massage; heat   ? ?  ?  ? ?  ? ? ? ? ? [facility] PT Assessment - 04/11/21 0001   ? ?  ? Assessment  ? Medical Diagnosis Chronic thoracic back pain   ? Referring Provider (PT) Dr Janne Napoleonaniel Luppens   ? Onset Date/Surgical Date 01/13/12   increasingly worse in the past 6 months  ? Hand Dominance Right   ? Next MD Visit to schedule   ? Prior Therapy here for Rt shoulder pain and LBP   ?  ? Precautions  ? Precautions None   ?  ? Restrictions  ? Weight Bearing Restrictions No   ?  ? Balance Screen  ? Has the patient fallen in the past 6 months Yes   ? How many times? 1 slipped in wet weather   ? Has the patient had a decrease in activity level because of a fear of falling?  No   ? Is the patient reluctant to leave their home because of a fear of falling?  No   ?  ?  Home Environment  ? Living Environment Private residence   ? Living Arrangements Spouse/significant other;Children   ?  ? Prior Function  ? Level of Independence Independent   ? Vocation Full time employment   ? 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 Leisure household chores; childcare; crafts   ?  ? Sensation  ? Additional Comments intermittent tingling into Rt > Lt hand resolves with movement in ~ 2-5 minutes   ?  ? Posture/Postural Control  ? Posture Comments head forward; shoudlers rounded and elevated; head of the humerus anterior in orientation; scapulae abducted and rotated along the thoracic spine; increased thoracic kyphosis   ?  ? AROM  ? Right Shoulder Extension 50 Degrees   ? Right Shoulder Flexion 148 Degrees   ? Right Shoulder ABduction 147 Degrees   ? Right Shoulder Internal Rotation --   thumb T7  ? Right Shoulder External Rotation 90 Degrees   ? Left Shoulder Extension 50 Degrees   ? Left Shoulder Flexion  157 Degrees   ? Left Shoulder ABduction 145 Degrees   ? Left Shoulder Internal Rotation --   thumb T7  ? Left Shoulder External Rotation 90 Degrees   ?  ? Strength  ? Right Shoulder Flexion 4+/5   ? Right Shoulder Extension 5/5   ? Right Shoulder ABduction 4+/5   ? Right Shoulder Internal Rotation 5/5   ? Right Shoulder External Rotation 4+/5   ? Right Shoulder Horizontal ABduction 4/5   ? Left Shoulder Flexion 5/5   ? Left Shoulder Extension 5/5   ? Left Shoulder ABduction 4+/5   ? Left Shoulder Internal Rotation 5/5   ? Left Shoulder External Rotation 4+/5   ? Left Shoulder Horizontal ABduction 4/5   ?  ? Palpation  ? Spinal mobility hypomobile thoracic spine with PA mobs   ? Palpation comment muscular tightness bilat pecs; upper trap; leveator; teres; thoracic paraspinals   ? ?  ?  ? ?  ? ? ? ? ? ? ? ? ? ? ? ? ? ?Objective measurements completed on examination: See above findings.  ? ? ? ? ? [facility] Adult PT Treatment/Exercise - 04/11/21 0001   ? ?  ? Neuro Re-ed   ? Neuro Re-ed Details  working on posture and alignment   ?  ? Shoulder Exercises: Standing  ? Other Standing Exercises axial extension 5 sec x 5 reps; scap squeeze 5 sec x 5 reps; L's x 10; W's x 10 with swim noodle   ?  ? Shoulder Exercises: Stretch  ? Other Shoulder Stretches doorway stretch 3 positions 30 sec x 2 reps each position   ? ?  ?  ? ?  ? ? ? ? ? ? ? ? ? ? PT Education - 04/11/21 0922   ? ? Education Details HEP office ergonomics   ? Person(s) Educated Patient   ? Methods Explanation;Demonstration;Tactile cues;Verbal cues;Handout   ? Comprehension Verbalized understanding;Returned demonstration;Verbal cues required;Tactile cues required   ? ?  ?  ? ?  ? ? ? ? ? ? PT Long Term Goals - 04/11/21 1042   ? ?  ? PT LONG TERM GOAL #1  ? Title Improve posture and alignment with patient to demonstrate improved upright posture with posterior shoulder girdle engaged   ? Time 8   ? Period Weeks   ? Target Date  06/06/21   ?  ? PT LONG TERM GOAL #2  ?  Title Increase UE strength by 1/2 muscle grade   ? Baseline -   ? Time 8   ? Period Weeks   ? Status New   ? Target Date 06/06/21   ?  ? PT LONG TERM GOAL #3  ? Title Patient verbalizes and/or demonstrates proper sitting/standing posture   ? Baseline -   ? Time 8   ? Period Weeks   ? Status New   ? Target Date 06/06/21   ?  ? PT LONG TERM GOAL #4  ? Title Independent in HEP   ? Baseline -   ? Time 8   ? Period Weeks   ? Status New   ? Target Date 06/06/21   ? ?  ?  ? ?  ? ? ? ? ? ? ? ? ? Plan - 04/11/21 0932   ? ? Clinical Impression Statement Patient reports history of midback pain for the past 9 years with increasing symptoms in the past several months. She has pain on a constant basis with variable intensity. Symptoms have increased since her pregnancy with her dughter and with her current job sitting at desk/computer. Patient has poor posture and alignment; limited shoulder ROM/mobility/strength; poor postural strength; pain with functional activities. Patient will benefit from PT to address problems identified.   ? Personal Factors and Comorbidities Past/Current Experience   ? Examination-Activity Limitations Bend;Carry;Lift   ? Examination-Participation Restrictions Occupation;Cleaning   ? Stability/Clinical Decision Making Stable/Uncomplicated   ? Clinical Decision Making Low   ? Rehab Potential Good   ? PT Frequency 1x / week   ? PT Duration 8 weeks   ? PT Treatment/Interventions Aquatic Therapy;Electrical Stimulation;Iontophoresis 4mg /ml Dexamethasone;Moist Heat;Cryotherapy;Neuromuscular re-education;Balance training;Therapeutic exercise;Therapeutic activities;Patient/family education;Manual techniques;Passive range of motion;Dry needling;Taping;Vasopneumatic Device   ? PT Next Visit Plan review and progress with HEP   ? PT Home Exercise Plan [email or web address]   ? Consulted and Agree with Plan of Care Patient   ? ?  ?  ? ?  ? ? ?Patient will benefit from skilled therapeutic intervention in order to improve the  following deficits and impairments:  Decreased activity tolerance, Pain, Improper body mechanics, Decreased mobility, Decreased strength, Postural dysfunction ? ?Visit Diagnosis: ?Chronic bilateral thoracic ba

## 2021-04-17 ENCOUNTER — Ambulatory Visit: Payer: BC Managed Care – PPO | Attending: Plastic Surgery | Admitting: Rehabilitative and Restorative Service Providers"

## 2021-04-17 ENCOUNTER — Encounter: Payer: Self-pay | Admitting: Rehabilitative and Restorative Service Providers"

## 2021-04-17 DIAGNOSIS — R29898 Other symptoms and signs involving the musculoskeletal system: Secondary | ICD-10-CM | POA: Insufficient documentation

## 2021-04-17 DIAGNOSIS — G8929 Other chronic pain: Secondary | ICD-10-CM | POA: Diagnosis present

## 2021-04-17 DIAGNOSIS — M6281 Muscle weakness (generalized): Secondary | ICD-10-CM | POA: Diagnosis present

## 2021-04-17 DIAGNOSIS — R293 Abnormal posture: Secondary | ICD-10-CM | POA: Insufficient documentation

## 2021-04-17 DIAGNOSIS — M791 Myalgia, unspecified site: Secondary | ICD-10-CM | POA: Diagnosis present

## 2021-04-17 DIAGNOSIS — M546 Pain in thoracic spine: Secondary | ICD-10-CM | POA: Insufficient documentation

## 2021-04-17 NOTE — Therapy (Signed)
[place] ?[facility] ?[address] ?[place], [place], [address] ?Phone: [phone]   Fax:  [phone] ? ?Physical Therapy Treatment ? ?Patient Details  ?Name: Jamie Manning ?MRN: [id number] ?Date of Birth: 20-Dec-1990 ?Referring Provider (PT): Dr [name] ? ? ?Encounter Date: 04/17/2021 ? ? PT End of Session - 04/17/21 0851   ? ? Visit Number 2   ? Number of Visits 8   ? Date for PT Re-Evaluation 06/06/21   ? PT Start Time 0850   pt 5 min late for appt  ? PT Stop Time 0930   930  ? PT Time Calculation (min) 40 min   ? Activity Tolerance Patient tolerated treatment well   ? ?  ?  ? ?  ? ? ?Past Medical History:  ?Diagnosis Date  ? Allergy   ? Anxiety 2020  ? COPD (chronic obstructive pulmonary disease) (HCC)   ? No pertinent past medical history   ? Osteoporosis   ? ? ?Past Surgical History:  ?Procedure Laterality Date  ? CESAREAN SECTION  11/26/2017  ? Due to a chorioangioma  ? EYE SURGERY    ? Scar tissue removal  ? TUBAL LIGATION    ? ? ?There were no vitals filed for this visit. ? ? Subjective Assessment - 04/17/21 0852   ? ? Subjective Patient reports that she has been doing "a few" of her exercises at home and they do seem to help. Back to work yesterday for the first time since the first of February and did [place].   ? Currently in Pain? Yes   ? Pain Score 2    ? Pain Location Thoracic   ? Pain Orientation Right;Left;Posterior   ? Pain Descriptors / Indicators Aching;Nagging;Burning;Sharp   ? Pain Type Chronic pain   ? ?  ?  ? ?  ? ? ? ? ? [facility] PT Assessment - 04/17/21 0001   ? ?  ? Assessment  ? Medical Diagnosis Chronic thoracic back pain   ? Referring Provider (PT) Dr [name]   ? Onset Date/Surgical Date 01/13/12   increasingly worse in the past 6 months  ? Hand Dominance Right   ? Next MD Visit to schedule   ? Prior Therapy here for Rt shoulder pain and LBP   ?  ? Posture/Postural Control  ? Posture Comments head forward; shoulders rounded and  elevated; head of the humerus anterior in orientation; scapulae abducted and rotated along the thoracic spine; increased thoracic kyphosis   ?  ? Palpation  ? Spinal mobility hypomobile thoracic spine with PA mobs   ? Palpation comment muscular tightness bilat pecs; upper trap; leveator; teres; thoracic paraspinals   ? ?  ?  ? ?  ? ? ? ? ? ? ? ? ? ? ? ? ? ? ? ? [facility] Adult PT Treatment/Exercise - 04/17/21 0001   ? ?  ? Shoulder Exercises: Standing  ? Row Strengthening;Both;10 reps;Theraband   ? Theraband Level (Shoulder Row) Level 4 (Blue)   ? Row Limitations bow and arrow Rt/Lt x 10 reps blue TB   ? Retraction Strengthening;Both;10 reps;Theraband   ? Theraband Level (Shoulder Retraction) Level 2 (Red)   ? Other Standing Exercises axial extension 5 sec x 5 reps; scap squeeze 5 sec x 5 reps; L's x 10; W's x 10 with swim noodle   ? Other Standing Exercises lat pull blue TB x 10 reps 5 sec hold   ?  ? Shoulder Exercises: Therapy  Ball  ? Flexion Both   ? Flexion Limitations 2 reps stretching overhead 20 sec hold   ? Other Therapy Ball Exercises bouncing ball on wall 1 min x 3 reps   ?  ? Shoulder Exercises: ROM/Strengthening  ? UBE (Upper Arm Bike) L4 x 4 min alternating fwd/back 1 min   ? Wall Pushups 10 reps   ? Wall Pushups Limitations 5 sec hold   ?  ? Shoulder Exercises: Stretch  ? Other Shoulder Stretches doorway stretch 3 positions 30 sec x 3 reps each position   ? ?  ?  ? ?  ? ? ? ? ? ? ? ? ? ? PT Education - 04/17/21 0924   ? ? Education Details HEP   ? Person(s) Educated Patient   ? Methods Explanation;Demonstration;Tactile cues;Verbal cues;Handout   ? Comprehension Verbalized understanding;Returned demonstration;Verbal cues required;Tactile cues required   ? ?  ?  ? ?  ? ? ? ? ? ? PT Long Term Goals - 04/11/21 1042   ? ?  ? PT LONG TERM GOAL #1  ? Title Improve posture and alignment with patient to demonstrate improved upright posture with posterior shoulder girdle engaged   ? Time 8   ? Period Weeks   ?  Target Date 06/06/21   ?  ? PT LONG TERM GOAL #2  ? Title Increase UE strength by 1/2 muscle grade   ? Baseline -   ? Time 8   ? Period Weeks   ? Status New   ? Target Date 06/06/21   ?  ? PT LONG TERM GOAL #3  ? Title Patient verbalizes and/or demonstrates proper sitting/standing posture   ? Baseline -   ? Time 8   ? Period Weeks   ? Status New   ? Target Date 06/06/21   ?  ? PT LONG TERM GOAL #4  ? Title Independent in HEP   ? Baseline -   ? Time 8   ? Period Weeks   ? Status New   ? Target Date 06/06/21   ? ?  ?  ? ?  ? ? ? ? ? ? ? ? Plan - 04/17/21 0853   ? ? Clinical Impression Statement Patient reports continued muscular tightness through the midback/thoracic spine area. She has worked on some of her exercises at home. Encouraged patient to continue with exercises consistently. Reviewed and progressed exercises for home.   ? Rehab Potential Good   ? PT Frequency 1x / week   ? PT Duration 8 weeks   ? PT Treatment/Interventions Aquatic Therapy;Electrical Stimulation;Iontophoresis 4mg /ml Dexamethasone;Moist Heat;Cryotherapy;Neuromuscular re-education;Balance training;Therapeutic exercise;Therapeutic activities;Patient/family education;Manual techniques;Passive range of motion;Dry needling;Taping;Vasopneumatic Device   ? PT Next Visit Plan review and progress with HEP   ? PT Home Exercise Plan [email or web address]   ? Consulted and Agree with Plan of Care Patient   ? ?  ?  ? ?  ? ? ?Patient will benefit from skilled therapeutic intervention in order to improve the following deficits and impairments:    ? ?Visit Diagnosis: ?Chronic bilateral thoracic back pain ? ?Abnormal posture ? ?Other symptoms and signs involving the musculoskeletal system ? ?Muscle weakness (generalized) ? ? ? ? ?Problem List ?There are no problems to display for this patient. ? ? ? , PT, MPH  ?04/17/2021, 9:26 AM ? ?[place] [facility] ?[address] ?[place], [place], [place] ?Phone:  [phone]   Fax:  [phone] ? ?Name: Jamie Manning ?MRN:  [id number] ?Date of Birth: 01-Sep-1990 ? ? ? ?

## 2021-04-17 NOTE — Patient Instructions (Signed)
Access Code: [email or web address] ?URL: [email or web address] ?Date: 04/17/2021 ?Prepared by: Corlis Leak ? ?Exercises ?- Doorway Pec Stretch at 60 Degrees Abduction  - 3 x daily - 7 x weekly - 1 sets - 3 reps ?- Doorway Pec Stretch at 90 Degrees Abduction  - 3 x daily - 7 x weekly - 1 sets - 3 reps - 30 seconds  hold ?- Doorway Pec Stretch at 120 Degrees Abduction  - 3 x daily - 7 x weekly - 1 sets - 3 reps - 30 second hold  hold ?- Seated Cervical Retraction  - 3 x daily - 7 x weekly - 1 sets - 10 reps ?- Standing Scapular Retraction  - 3 x daily - 7 x weekly - 1 sets - 10 reps - 10 hold ?- Shoulder External Rotation and Scapular Retraction  - 3 x daily - 7 x weekly - 1 sets - 10 reps -   hold ?- Shoulder External Rotation in 45 Degrees Abduction  - 2 x daily - 7 x weekly - 1-2 sets - 10 reps - 30 sec  hold ?- Standing Bilateral Low Shoulder Row with Anchored Resistance  - 2 x daily - 7 x weekly - 1-3 sets - 10 reps - 2-3 sec  hold ?- Drawing Bow  - 1 x daily - 7 x weekly - 1 sets - 10 reps - 3 sec  hold ?- Shoulder External Rotation and Scapular Retraction with Resistance  - 2 x daily - 7 x weekly - 1 sets - 10 reps - 3-5 sec  hold ?- Standing Lat Pull Down with Resistance - Elbows Bent  - 2 x daily - 7 x weekly - 1 sets - 10 reps - 3 sec  hold ?

## 2021-04-23 NOTE — Progress Notes (Signed)
   Complete physical exam  Patient: Jamie Manning   DOB: 11/01/2000   31 y.o. Female  MRN: [id number]  Subjective:    No chief complaint on file.   Jamie Manning is a 31 y.o. female who presents today for a complete physical exam. She reports consuming a {diet types:17450} diet. {types:19826} She generally feels {DESC; WELL/FAIRLY WELL/POORLY:18703}. She reports sleeping {DESC; WELL/FAIRLY WELL/POORLY:18703}. She {does/does not:200015} have additional problems to discuss today.    Most recent fall risk assessment:    07/09/2021   10:42 AM  Fall Risk   Falls in the past year? 0  Number falls in past yr: 0  Injury with Fall? 0  Risk for fall due to : No Fall Risks  Follow up Falls evaluation completed     Most recent depression screenings:    07/09/2021   10:42 AM 05/30/2020   10:46 AM  PHQ 2/9 Scores  PHQ - 2 Score 0 0  PHQ- 9 Score 5     {VISON DENTAL STD PSA (Optional):27386}  {History (Optional):23778}  Patient Care Team: Anniece Bleiler, NP as PCP - General (Nurse Practitioner)   Outpatient Medications Prior to Visit  Medication Sig   fluticasone (FLONASE) 50 MCG/ACT nasal spray Place 2 sprays into both nostrils in the morning and at bedtime. After 7 days, reduce to once daily.   norgestimate-ethinyl estradiol (SPRINTEC 28) 0.25-35 MG-MCG tablet Take 1 tablet by mouth daily.   Nystatin POWD Apply liberally to affected area 2 times per day   spironolactone (ALDACTONE) 100 MG tablet Take 1 tablet (100 mg total) by mouth daily.   No facility-administered medications prior to visit.    ROS        Objective:     There were no vitals taken for this visit. {Vitals History (Optional):23777}  Physical Exam   No results found for any visits on 08/14/21. {Show previous labs (optional):23779}    Assessment & Plan:    Routine Health Maintenance and Physical Exam  Immunization History  Administered Date(s) Administered   DTaP 01/15/1999, 03/13/1999,  05/22/1999, 02/05/2000, 08/21/2003   Hepatitis A 06/17/2007, 06/22/2008   Hepatitis B 11/02/1998, 12/10/1998, 05/22/1999   HiB (PRP-OMP) 01/15/1999, 03/13/1999, 05/22/1999, 02/05/2000   IPV 01/15/1999, 03/13/1999, 11/10/1999, 08/21/2003   Influenza,inj,Quad PF,6+ Mos 09/22/2013   Influenza-Unspecified 12/23/2011   MMR 11/09/2000, 08/21/2003   Meningococcal Polysaccharide 06/22/2011   Pneumococcal Conjugate-13 02/05/2000   Pneumococcal-Unspecified 05/22/1999, 08/05/1999   Tdap 06/22/2011   Varicella 11/10/1999, 06/17/2007    Health Maintenance  Topic Date Due   HIV Screening  Never done   Hepatitis C Screening  Never done   INFLUENZA VACCINE  08/12/2021   PAP-Cervical Cytology Screening  08/14/2021 (Originally 11/01/2019)   PAP SMEAR-Modifier  08/14/2021 (Originally 11/01/2019)   TETANUS/TDAP  08/14/2021 (Originally 06/21/2021)   HPV VACCINES  Discontinued   COVID-19 Vaccine  Discontinued    Discussed health benefits of physical activity, and encouraged her to engage in regular exercise appropriate for her age and condition.  Problem List Items Addressed This Visit   None Visit Diagnoses     Annual physical exam    -  Primary   Cervical cancer screening       Need for Tdap vaccination          No follow-ups on file.     Chastin Riesgo, NP   

## 2021-04-24 ENCOUNTER — Ambulatory Visit: Payer: BC Managed Care – PPO | Admitting: Rehabilitative and Restorative Service Providers"

## 2021-04-24 ENCOUNTER — Encounter: Payer: Self-pay | Admitting: Rehabilitative and Restorative Service Providers"

## 2021-04-24 ENCOUNTER — Ambulatory Visit (INDEPENDENT_AMBULATORY_CARE_PROVIDER_SITE_OTHER): Payer: Self-pay | Admitting: Medical-Surgical

## 2021-04-24 DIAGNOSIS — R29898 Other symptoms and signs involving the musculoskeletal system: Secondary | ICD-10-CM

## 2021-04-24 DIAGNOSIS — G8929 Other chronic pain: Secondary | ICD-10-CM

## 2021-04-24 DIAGNOSIS — M791 Myalgia, unspecified site: Secondary | ICD-10-CM

## 2021-04-24 DIAGNOSIS — M6281 Muscle weakness (generalized): Secondary | ICD-10-CM

## 2021-04-24 DIAGNOSIS — Z0289 Encounter for other administrative examinations: Secondary | ICD-10-CM

## 2021-04-24 DIAGNOSIS — J4520 Mild intermittent asthma, uncomplicated: Secondary | ICD-10-CM

## 2021-04-24 DIAGNOSIS — R293 Abnormal posture: Secondary | ICD-10-CM

## 2021-04-24 DIAGNOSIS — M546 Pain in thoracic spine: Secondary | ICD-10-CM | POA: Diagnosis not present

## 2021-04-24 DIAGNOSIS — Z91199 Patient's noncompliance with other medical treatment and regimen due to unspecified reason: Secondary | ICD-10-CM

## 2021-04-24 NOTE — Therapy (Signed)
[place] ?[address] ?[place], [place], [address] ?Phone: [phone]   Fax:  [phone] ? ?Physical Therapy Treatment ? ?Patient Details  ?Name: Jamie Manning ?MRN: [id number] ?Date of Birth: 25-Jun-1990 ?Referring Provider (PT): Dr [name] ? ? ?Encounter Date: 04/24/2021 ? ? PT End of Session - 04/24/21 1626   ? ? Visit Number 3   ? Number of Visits 8   ? Date for PT Re-Evaluation 06/06/21   ? PT Start Time 1622   ? PT Stop Time 1700   ? PT Time Calculation (min) 38 min   ? Activity Tolerance Patient tolerated treatment well   ? ?  ?  ? ?  ? ? ?Past Medical History:  ?Diagnosis Date  ? Allergy   ? Anxiety 2020  ? COPD (chronic obstructive pulmonary disease) (HCC)   ? No pertinent past medical history   ? Osteoporosis   ? ? ?Past Surgical History:  ?Procedure Laterality Date  ? CESAREAN SECTION  11/26/2017  ? Due to a chorioangioma  ? EYE SURGERY    ? Scar tissue removal  ? TUBAL LIGATION    ? ? ?There were no vitals filed for this visit. ? ? Subjective Assessment - 04/24/21 1627   ? ? Subjective Patient reports that she is exhausted. She states that she has worked on the exercises which have helped since she has returned to work.   ? Currently in Pain? Yes   ? Pain Score 2    ? Pain Location Thoracic   ? Pain Orientation Right;Left;Posterior   ? Pain Descriptors / Indicators Aching;Nagging;Tiring   ? Pain Type Chronic pain   ? ?  ?  ? ?  ? ? ? ? ? [facility] PT Assessment - 04/24/21 0001   ? ?  ? Assessment  ? Medical Diagnosis Chronic thoracic back pain   ? Referring Provider (PT) Dr [name]   ? Onset Date/Surgical Date 01/13/12   increasingly worse in the past 6 months  ? Hand Dominance Right   ? Next MD Visit to schedule   ? Prior Therapy here for Rt shoulder pain and LBP   ?  ? Strength  ? Right Shoulder Flexion 5/5   ? Right Shoulder Extension 5/5   ? Right Shoulder ABduction 4+/5   ? Right Shoulder Internal Rotation 5/5   ? Right Shoulder  External Rotation 4+/5   ? Right Shoulder Horizontal ABduction 4+/5   ? Left Shoulder Flexion 5/5   ? Left Shoulder Extension 5/5   ? Left Shoulder ABduction 4+/5   ? Left Shoulder Internal Rotation 5/5   ? Left Shoulder External Rotation 4+/5   ? Left Shoulder Horizontal ABduction 4/5   ? ?  ?  ? ?  ? ? ? ? ? ? ? ? ? ? ? ? ? ? ? ? [facility] Adult PT Treatment/Exercise - 04/24/21 0001   ? ?  ? Shoulder Exercises: Standing  ? External Rotation Strengthening;Both;20 reps;Theraband   ? Theraband Level (Shoulder External Rotation) Level 4 (Blue)   ? Extension Strengthening;Both;20 reps;Theraband   ? Theraband Level (Shoulder Extension) Level 4 (Blue)   ? Row Strengthening;Both;10 reps;Theraband   ? Theraband Level (Shoulder Row) Level 4 (Blue)   ? Row Limitations bow and arrow Rt/Lt x 10 reps blue TB   ? Retraction Strengthening;Both;10 reps;Theraband   ? Theraband Level (Shoulder Retraction) Level 2 (Red)   ? Other Standing Exercises W's red TB x 20  reps   ? Other Standing Exercises lat pull blue TB x 10 reps 5 sec hold   ?  ? Shoulder Exercises: Therapy Ball  ? Flexion Both   ? Flexion Limitations 5 reps stretching overhead 20 sec hold   ?  ? Shoulder Exercises: ROM/Strengthening  ? UBE (Upper Arm Bike) L4 x 4 min alternating fwd/back 1 min   ? Wall Pushups 10 reps   ? Wall Pushups Limitations 10 sec hold   ? Modified Plank 3 reps;60 seconds   ? Modified Plank Limitations counter plank   ?  ? Shoulder Exercises: Stretch  ? Other Shoulder Stretches doorway stretch 3 positions 30 sec x 3 reps each position   ? ?  ?  ? ?  ? ? ? ? ? ? ? ? ? ? PT Education - 04/24/21 1703   ? ? Education Details HEP   ? Person(s) Educated Patient   ? Methods Explanation;Demonstration;Tactile cues;Verbal cues;Handout   ? Comprehension Verbalized understanding;Returned demonstration;Verbal cues required;Tactile cues required   ? ?  ?  ? ?  ? ? ? ? ? ? PT Long Term Goals - 04/11/21 1042   ? ?  ? PT LONG TERM GOAL #1  ? Title Improve posture and  alignment with patient to demonstrate improved upright posture with posterior shoulder girdle engaged   ? Time 8   ? Period Weeks   ? Target Date 06/06/21   ?  ? PT LONG TERM GOAL #2  ? Title Increase UE strength by 1/2 muscle grade   ? Baseline -   ? Time 8   ? Period Weeks   ? Status New   ? Target Date 06/06/21   ?  ? PT LONG TERM GOAL #3  ? Title Patient verbalizes and/or demonstrates proper sitting/standing posture   ? Baseline -   ? Time 8   ? Period Weeks   ? Status New   ? Target Date 06/06/21   ?  ? PT LONG TERM GOAL #4  ? Title Independent in HEP   ? Baseline -   ? Time 8   ? Period Weeks   ? Status New   ? Target Date 06/06/21   ? ?  ?  ? ?  ? ? ? ? ? ? ? ? Plan - 04/24/21 1629   ? ? Clinical Impression Statement Patient has been working on exercises at home and during work. She has been exhausted and tired since returning to work last week. Patient has continued muscular tightness and discomfort through the thoracic spine/upper back/neck area.   ? Rehab Potential Good   ? PT Frequency 1x / week   ? PT Duration 8 weeks   ? PT Treatment/Interventions Aquatic Therapy;Electrical Stimulation;Iontophoresis 4mg /ml Dexamethasone;Moist Heat;Cryotherapy;Neuromuscular re-education;Balance training;Therapeutic exercise;Therapeutic activities;Patient/family education;Manual techniques;Passive range of motion;Dry needling;Taping;Vasopneumatic Device   ? PT Next Visit Plan review and progress with HEP   ? PT Home Exercise Plan [email or web address]   ? Consulted and Agree with Plan of Care Patient   ? ?  ?  ? ?  ? ? ?Patient will benefit from skilled therapeutic intervention in order to improve the following deficits and impairments:    ? ?Visit Diagnosis: ?Chronic bilateral thoracic back pain ? ?Abnormal posture ? ?Other symptoms and signs involving the musculoskeletal system ? ?Muscle weakness (generalized) ? ?Myalgia ? ? ? ? ?Problem List ?There are no problems to display for this patient. ? ? ?Val Rileselyn P Domonick Sittner, PT, MPH  ?04/24/2021,  5:03 PM ? ? ?[facility] ?[address] ?[place], [place], [address] ?Phone: [phone]   Fax:  [phone] ? ?Name: Jamie Manning ?MRN: [id number] ?Date of Birth: Jul 28, 1990 ? ? ? ?

## 2021-04-24 NOTE — Patient Instructions (Signed)
Access Code: [email or web address] ?URL: [email or web address] ?Date: 04/24/2021 ?Prepared by: Corlis Leak ? ?Exercises ?- Doorway Pec Stretch at 60 Degrees Abduction  - 3 x daily - 7 x weekly - 1 sets - 3 reps ?- Doorway Pec Stretch at 90 Degrees Abduction  - 3 x daily - 7 x weekly - 1 sets - 3 reps - 30 seconds  hold ?- Doorway Pec Stretch at 120 Degrees Abduction  - 3 x daily - 7 x weekly - 1 sets - 3 reps - 30 second hold  hold ?- Seated Cervical Retraction  - 3 x daily - 7 x weekly - 1 sets - 10 reps ?- Standing Scapular Retraction  - 3 x daily - 7 x weekly - 1 sets - 10 reps - 10 hold ?- Shoulder External Rotation and Scapular Retraction  - 3 x daily - 7 x weekly - 1 sets - 10 reps -   hold ?- Shoulder External Rotation in 45 Degrees Abduction  - 2 x daily - 7 x weekly - 1-2 sets - 10 reps - 30 sec  hold ?- Standing Bilateral Low Shoulder Row with Anchored Resistance  - 2 x daily - 7 x weekly - 1-3 sets - 10 reps - 2-3 sec  hold ?- Drawing Bow  - 1 x daily - 7 x weekly - 1 sets - 10 reps - 3 sec  hold ?- Shoulder External Rotation and Scapular Retraction with Resistance  - 1 x daily - 7 x weekly - 1 sets - 10 reps - 3-5 sec  hold ?- Standing Lat Pull Down with Resistance - Elbows Bent  - 1 x daily - 7 x weekly - 1 sets - 10 reps - 3 sec  hold ?- Plank on Counter  - 1 x daily - 7 x weekly - 1 sets - 3 reps - 60 sec  hold ?- Shoulder External Rotation with Anchored Resistance  - 1 x daily - 7 x weekly - 2 sets - 10 reps - 3 sec  hold ?- Shoulder W - External Rotation with Resistance  - 2 x daily - 7 x weekly - 1-2 sets - 10 reps - 3 sec  hold ?

## 2021-04-29 ENCOUNTER — Ambulatory Visit: Payer: BC Managed Care – PPO | Admitting: Rehabilitative and Restorative Service Providers"

## 2021-04-29 ENCOUNTER — Encounter: Payer: Self-pay | Admitting: Rehabilitative and Restorative Service Providers"

## 2021-04-29 DIAGNOSIS — M6281 Muscle weakness (generalized): Secondary | ICD-10-CM

## 2021-04-29 DIAGNOSIS — M546 Pain in thoracic spine: Secondary | ICD-10-CM | POA: Diagnosis not present

## 2021-04-29 DIAGNOSIS — R293 Abnormal posture: Secondary | ICD-10-CM

## 2021-04-29 DIAGNOSIS — G8929 Other chronic pain: Secondary | ICD-10-CM

## 2021-04-29 DIAGNOSIS — M791 Myalgia, unspecified site: Secondary | ICD-10-CM

## 2021-04-29 DIAGNOSIS — R29898 Other symptoms and signs involving the musculoskeletal system: Secondary | ICD-10-CM

## 2021-04-29 NOTE — Therapy (Signed)
[place] ?[facility] ?[address] ?[place], [place], [address] ?Phone: [phone]   Fax:  [phone] ? ?Physical Therapy Treatment ? ?Patient Details  ?Name: Jamie Manning ?MRN: [id number] ?Date of Birth: May 04, 1990 ?Referring Provider (PT): Dr [name] ? ? ?Encounter Date: 04/29/2021 ? ? PT End of Session - 04/29/21 2878   ? ? Visit Number 4   ? Number of Visits 8   ? Date for PT Re-Evaluation 06/06/21   ? PT Start Time 0845   ? PT Stop Time 0930   ? PT Time Calculation (min) 45 min   ? Activity Tolerance Patient tolerated treatment well   ? ?  ?  ? ?  ? ? ?Past Medical History:  ?Diagnosis Date  ? Allergy   ? Anxiety 2020  ? COPD (chronic obstructive pulmonary disease) (HCC)   ? No pertinent past medical history   ? Osteoporosis   ? ? ?Past Surgical History:  ?Procedure Laterality Date  ? CESAREAN SECTION  11/26/2017  ? Due to a chorioangioma  ? EYE SURGERY    ? Scar tissue removal  ? TUBAL LIGATION    ? ? ?There were no vitals filed for this visit. ? ? Subjective Assessment - 04/29/21 0900   ? ? Subjective Back to work and adjusting OK. Pain is not too bad.   ? Currently in Pain? Yes   ? Pain Score 3    ? Pain Location Thoracic   ? Pain Orientation Right;Left   ? Pain Descriptors / Indicators Aching;Nagging;Tiring   ? Pain Type Chronic pain   ? Pain Onset More than a month ago   ? Pain Frequency Constant   ? ?  ?  ? ?  ? ? ? ? ? ? ? ? ? ? ? ? ? ? ? ? ? ? ? ? [facility] Adult PT Treatment/Exercise - 04/29/21 0001   ? ?  ? Self-Care  ? Lifting hinged hip deadlift 15# KB x 10 reps from 8 in stool   ?  ? Shoulder Exercises: Standing  ? External Rotation Strengthening;Both;20 reps;Theraband   ? Theraband Level (Shoulder External Rotation) Level 4 (Blue)   ? Extension Strengthening;Both;20 reps;Theraband   ? Theraband Level (Shoulder Extension) Level 4 (Blue)   ? Row Strengthening;Both;10 reps;Theraband   ? Theraband Level (Shoulder Row) Level 4 (Blue)   ? Row  Limitations bow and arrow Rt/Lt x 10 reps blue TB   ? Retraction Strengthening;Both;10 reps;Theraband   ? Theraband Level (Shoulder Retraction) Level 2 (Red)   ? Diagonals Limitations antirotation blue TB x 10 reps x 2 sets each side   ? Other Standing Exercises W's red TB x 20 reps   ? Other Standing Exercises lat pull blue TB x 10 reps 5 sec hold   ?  ? Shoulder Exercises: ROM/Strengthening  ? UBE (Upper Arm Bike) L4 x 4 min alternating fwd/back 1 min   ? Wall Pushups 10 reps   ? Wall Pushups Limitations 10 sec hold   ? Modified Plank 3 reps;60 seconds   ? Modified Plank Limitations counter plank   ?  ? Shoulder Exercises: Stretch  ? Other Shoulder Stretches doorway stretch 3 positions 30 sec x 3 reps each position   ? ?  ?  ? ?  ? ? ? ? ? ? ? ? ? ? PT Education - 04/29/21 0917   ? ? Education Details HEP   ? Person(s) Educated Patient   ? Methods  Explanation;Demonstration;Tactile cues;Verbal cues;Handout   ? Comprehension Verbalized understanding;Returned demonstration;Verbal cues required;Tactile cues required   ? ?  ?  ? ?  ? ? ? ? ? ? PT Long Term Goals - 04/11/21 1042   ? ?  ? PT LONG TERM GOAL #1  ? Title Improve posture and alignment with patient to demonstrate improved upright posture with posterior shoulder girdle engaged   ? Time 8   ? Period Weeks   ? Target Date 06/06/21   ?  ? PT LONG TERM GOAL #2  ? Title Increase UE strength by 1/2 muscle grade   ? Baseline -   ? Time 8   ? Period Weeks   ? Status New   ? Target Date 06/06/21   ?  ? PT LONG TERM GOAL #3  ? Title Patient verbalizes and/or demonstrates proper sitting/standing posture   ? Baseline -   ? Time 8   ? Period Weeks   ? Status New   ? Target Date 06/06/21   ?  ? PT LONG TERM GOAL #4  ? Title Independent in HEP   ? Baseline -   ? Time 8   ? Period Weeks   ? Status New   ? Target Date 06/06/21   ? ?  ?  ? ?  ? ? ? ? ? ? ? ? Plan - 04/29/21 0902   ? ? Clinical Impression Statement Patient is working on exercises at home. She can feel that her  muscles are getting stronger. Patient continues to demonstrate good form with exercises. Progressing with posterior shoulder girdle strengthening.   ? Rehab Potential Good   ? PT Frequency 1x / week   ? PT Duration 8 weeks   ? PT Treatment/Interventions Aquatic Therapy;Electrical Stimulation;Iontophoresis 4mg /ml Dexamethasone;Moist Heat;Cryotherapy;Neuromuscular re-education;Balance training;Therapeutic exercise;Therapeutic activities;Patient/family education;Manual techniques;Passive range of motion;Dry needling;Taping;Vasopneumatic Device   ? PT Next Visit Plan review and progress with HEP   ? PT Home Exercise Plan [email or web address]   ? Consulted and Agree with Plan of Care Patient   ? ?  ?  ? ?  ? ? ?Patient will benefit from skilled therapeutic intervention in order to improve the following deficits and impairments:    ? ?Visit Diagnosis: ?Chronic bilateral thoracic back pain ? ?Abnormal posture ? ?Other symptoms and signs involving the musculoskeletal system ? ?Muscle weakness (generalized) ? ?Myalgia ? ? ? ? ?Problem List ?There are no problems to display for this patient. ? ? ? , PT, MPH  ?04/29/2021, 9:26 AM ? ?[facility] ?[address] ?[place], [place], [place] ?Phone: [phone]   Fax:  [phone] ? ?Name: Jamie Manning ?MRN: Pricilla [id number] ?Date of Birth: 05-02-1990 ? ? ? ?

## 2021-04-29 NOTE — Patient Instructions (Signed)
Access Code: [email or web address] ?URL: [email or web address] ?Date: 04/29/2021 ?Prepared by: Corlis Leak ? ?Exercises ?- Doorway Pec Stretch at 60 Degrees Abduction  - 3 x daily - 7 x weekly - 1 sets - 3 reps ?- Doorway Pec Stretch at 90 Degrees Abduction  - 3 x daily - 7 x weekly - 1 sets - 3 reps - 30 seconds  hold ?- Doorway Pec Stretch at 120 Degrees Abduction  - 3 x daily - 7 x weekly - 1 sets - 3 reps - 30 second hold  hold ?- Seated Cervical Retraction  - 3 x daily - 7 x weekly - 1 sets - 10 reps ?- Standing Scapular Retraction  - 3 x daily - 7 x weekly - 1 sets - 10 reps - 10 hold ?- Shoulder External Rotation and Scapular Retraction  - 3 x daily - 7 x weekly - 1 sets - 10 reps -   hold ?- Shoulder External Rotation in 45 Degrees Abduction  - 2 x daily - 7 x weekly - 1-2 sets - 10 reps - 30 sec  hold ?- Standing Bilateral Low Shoulder Row with Anchored Resistance  - 2 x daily - 7 x weekly - 1-3 sets - 10 reps - 2-3 sec  hold ?- Drawing Bow  - 1 x daily - 7 x weekly - 1 sets - 10 reps - 3 sec  hold ?- Shoulder External Rotation and Scapular Retraction with Resistance  - 1 x daily - 7 x weekly - 1 sets - 10 reps - 3-5 sec  hold ?- Standing Lat Pull Down with Resistance - Elbows Bent  - 1 x daily - 7 x weekly - 1 sets - 10 reps - 3 sec  hold ?- Plank on Counter  - 1 x daily - 7 x weekly - 1 sets - 3 reps - 60 sec  hold ?- Shoulder External Rotation with Anchored Resistance  - 1 x daily - 7 x weekly - 2 sets - 10 reps - 3 sec  hold ?- Shoulder W - External Rotation with Resistance  - 2 x daily - 7 x weekly - 1-2 sets - 10 reps - 3 sec  hold ?- Half Deadlift with Kettlebell  - 2 x daily - 7 x weekly - 1 sets - 5-10 reps ?- Anti-Rotation Lateral Stepping with Press  - 2 x daily - 7 x weekly - 1-2 sets - 10 reps - 2-3 sec  hold ?

## 2021-05-08 ENCOUNTER — Ambulatory Visit: Payer: BC Managed Care – PPO | Admitting: Rehabilitative and Restorative Service Providers"

## 2021-05-21 ENCOUNTER — Ambulatory Visit: Admitting: Family

## 2021-05-21 ENCOUNTER — Telehealth: Payer: Self-pay

## 2021-05-21 NOTE — Telephone Encounter (Signed)
Called patient, LMVM to call back and confirm if she has completed her PT.  ?

## 2021-06-04 ENCOUNTER — Ambulatory Visit: Payer: BC Managed Care – PPO | Attending: Medical-Surgical | Admitting: Physical Therapy

## 2021-06-04 DIAGNOSIS — M6281 Muscle weakness (generalized): Secondary | ICD-10-CM | POA: Diagnosis present

## 2021-06-04 DIAGNOSIS — G8929 Other chronic pain: Secondary | ICD-10-CM | POA: Diagnosis present

## 2021-06-04 DIAGNOSIS — M546 Pain in thoracic spine: Secondary | ICD-10-CM | POA: Diagnosis present

## 2021-06-04 DIAGNOSIS — R293 Abnormal posture: Secondary | ICD-10-CM | POA: Insufficient documentation

## 2021-06-04 NOTE — Therapy (Signed)
[facility] [address] [place], [place], [address] Phone: [phone]   Fax:  [phone]  Physical Therapy Treatment  Patient Details  Name: Jamie Manning MRN: [id number] Date of Birth: 03-05-1990 Referring Provider (PT): Dr [name]   Encounter Date: 06/04/2021 Rationale for Evaluation and Treatment Rehabilitation   PT End of Session - 06/04/21 1141     Visit Number 5    Number of Visits 8    Date for PT Re-Evaluation 06/06/21    PT Start Time 1058    PT Stop Time 1140    PT Time Calculation (min) 42 min    Activity Tolerance Patient tolerated treatment well    Behavior During Therapy [facility] for tasks assessed/performed             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   Subjective Assessment - 06/04/21 1100     Subjective Pt states she slept funny and is having mid back pain today    Patient Stated Goals decrease pain in pain midback and get stronger through midback    Currently in Pain? Yes    Pain Score 3     Pain Location Thoracic    Pain Orientation Mid    Pain Descriptors / Indicators Sore                               [facility] Adult PT Treatment/Exercise - 06/04/21 0001       Shoulder Exercises: Standing   External Rotation Strengthening;Both;20 reps;Theraband    Theraband Level (Shoulder External Rotation) Level 4 (Blue)    Extension 20 reps    Theraband Level (Shoulder Extension) Level 4 (Blue)    Row 20 reps    Theraband Level (Shoulder Row) Level 4 (Blue)    Row Limitations bow and arrow blue TB x 20 bilat     Retraction 20 reps    Theraband Level (Shoulder Retraction) Level 2 (Red)    Diagonals 10 reps;Right;Left    Theraband Level (Shoulder Diagonals) Level 2 (Red)    Diagonals Limitations antirotation blue TB x 10 reps x 2 sets each side    Other Standing Exercises hinged hip deadlift 15# from 8'' step    Other Standing Exercises lat pull down blue TB x 20      Shoulder Exercises: ROM/Strengthening   UBE (Upper Arm Bike) L4 x 4 min alternating fwd/back 1 min    Wall Pushups 10 reps    Modified Plank 3 reps;30 seconds    Modified Plank Limitations counter plank      Shoulder Exercises: Stretch   Other Shoulder Stretches doorway stretch 3 positions 30 sec x 3 reps each position                          PT Long Term Goals - 04/11/21 1042       PT LONG TERM GOAL #1   Title Improve posture and  alignment with patient to demonstrate improved upright posture with posterior shoulder girdle engaged    Time 8    Period Weeks    Target Date 06/06/21      PT LONG TERM GOAL #2   Title Increase UE strength by 1/2 muscle grade    Baseline -    Time 8    Period Weeks    Status New    Target Date 06/06/21      PT LONG TERM GOAL #3   Title Patient verbalizes and/or demonstrates proper sitting/standing posture    Baseline -    Time 8    Period Weeks    Status New    Target Date 06/06/21      PT LONG TERM GOAL #4   Title Independent in HEP    Baseline -    Time 8    Period Weeks    Status New    Target Date 06/06/21                   Plan - 06/04/21 1142     Clinical Impression Statement Pt states she is feeling better. Has been working on stretching at home. She requires rest breaks during strengthening exercises today. PT emphasized importance of continued strength training in HEP    PT Next Visit Plan d/c, progress HEP    PT Home Exercise Plan [email or web address]    Consulted and Agree with Plan of Care Patient             Patient will benefit from skilled  therapeutic intervention in order to improve the following deficits and impairments:     Visit Diagnosis: Chronic bilateral thoracic back pain  Abnormal posture  Muscle weakness (generalized)     Problem List There are no problems to display for this patient.   Maragret Vanacker, PT 06/04/2021, 11:44 AM  [facility] [address] [place], [place], [address] Phone: [phone]   Fax:  [phone]  Name: Jamie Manning MRN: [id number] Date of Birth: 12-Mar-1990

## 2021-06-17 ENCOUNTER — Ambulatory Visit: Payer: BC Managed Care – PPO | Attending: Medical-Surgical | Admitting: Physical Therapy

## 2021-06-17 DIAGNOSIS — M6281 Muscle weakness (generalized): Secondary | ICD-10-CM | POA: Diagnosis present

## 2021-06-17 DIAGNOSIS — R293 Abnormal posture: Secondary | ICD-10-CM | POA: Diagnosis present

## 2021-06-17 DIAGNOSIS — G8929 Other chronic pain: Secondary | ICD-10-CM | POA: Insufficient documentation

## 2021-06-17 DIAGNOSIS — M546 Pain in thoracic spine: Secondary | ICD-10-CM | POA: Insufficient documentation

## 2021-06-17 NOTE — Therapy (Signed)
[place], [place], [address] Phone: [phone]   Fax:  [phone]  Physical Therapy Treatment and Discharge  Patient Details  Name: Jamie Manning MRN: [id number] Date of Birth: 1990-05-31 Referring Provider (PT): Dr Lennice Sites   Encounter Date: 06/17/2021 Rationale for Evaluation and Treatment Rehabilitation    PT End of Session - 06/17/21 0829     Visit Number 6    Number of Visits 8    Date for PT Re-Evaluation 06/17/21    PT Start Time 0800    PT Stop Time 0829    PT Time Calculation (min) 29 min    Activity Tolerance Patient tolerated treatment well    Behavior During Therapy [facility] for tasks assessed/performed             Past Medical History:  Diagnosis Date   Allergy    Anxiety 2020   COPD (chronic obstructive pulmonary disease) ([place])    No pertinent past medical history    Osteoporosis     Past Surgical History:  Procedure Laterality Date   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   Subjective Assessment - 06/17/21 0803     Subjective Pt states she is doing well. Still has occasional mid back pain but not as bad    Patient Stated Goals decrease pain in pain midback and get stronger through midback    Currently in Pain? Yes    Pain Score 2     Pain Location Thoracic    Pain Orientation Mid    Pain Descriptors / Indicators Sore    Pain Type Chronic pain                [facility] PT Assessment - 06/17/21 0001       Assessment   Medical Diagnosis Chronic thoracic back pain    Referring Provider (PT) Dr Lennice Sites    Prior Therapy here for Rt shoulder pain and LBP      Strength   Right Shoulder Flexion 5/5    Right Shoulder Extension 5/5    Right Shoulder ABduction 4+/5    Right Shoulder Internal Rotation 5/5    Right Shoulder External  Rotation 4+/5    Right Shoulder Horizontal ABduction 4+/5    Left Shoulder Flexion 5/5    Left Shoulder Extension 5/5    Left Shoulder ABduction 4+/5    Left Shoulder Internal Rotation 5/5    Left Shoulder External Rotation 4+/5    Left Shoulder Horizontal ABduction 4/5                           [facility] Adult PT Treatment/Exercise - 06/17/21 0001       Shoulder Exercises: Standing   Extension 20 reps    Theraband Level (Shoulder Extension) Level 4 (Blue)    Row 20 reps    Theraband Level (Shoulder Row) Level 4 (Blue)    Row Limitations bow and arrow blue TB x 20 bilat    Retraction 20 reps    Diagonals 10 reps;Right;Left    Theraband Level (Shoulder Diagonals) Level 2 (Red)    Diagonals Limitations antirotation blue TB x 10 reps x 2 sets each side    Other Standing Exercises hinged hip deadlift 15#  from 8'' step    Other Standing Exercises lat pull down blue TB x 20      Shoulder Exercises: ROM/Strengthening   UBE (Upper Arm Bike) L4 x 4 min alternating fwd/back 1 min      Shoulder Exercises: Stretch   Other Shoulder Stretches doorway stretch 3 positions 30 sec x 3 reps each position                          PT Long Term Goals - 06/17/21 9678       PT LONG TERM GOAL #1   Title Improve posture and alignment with patient to demonstrate improved upright posture with posterior shoulder girdle engaged    Status Achieved      PT LONG TERM GOAL #2   Title Increase UE strength by 1/2 muscle grade    Status Achieved      PT LONG TERM GOAL #3   Title Patient verbalizes and/or demonstrates proper sitting/standing posture    Status Achieved      PT LONG TERM GOAL #4   Title Independent in HEP    Status Achieved                   Plan - 06/17/21 0829     Clinical Impression Statement Pt has met all goals and is independent in HEP. She is ready for d/c to HEP    PT Next Visit Plan d/c    PT Home Exercise Plan [id number]    Consulted  and Agree with Plan of Care Patient             Patient will benefit from skilled therapeutic intervention in order to improve the following deficits and impairments:     Visit Diagnosis: Chronic bilateral thoracic back pain - Plan: PT plan of care cert/re-cert  Abnormal posture - Plan: PT plan of care cert/re-cert  Muscle weakness (generalized) - Plan: PT plan of care cert/re-cert     Problem List There are no problems to display for this patient.  PHYSICAL THERAPY DISCHARGE SUMMARY  Visits from Start of Care: 6  Current functional level related to goals / functional outcomes: Improved strength and posture   Remaining deficits: See above   Education / Equipment: HEP   Patient agrees to discharge. Patient goals were met. Patient is being discharged due to meeting the stated rehab goals.  Vega Withrow, PT 06/17/2021, 8:31 AM  [facility] [place], [place], [address] Phone: [phone]   Fax:  [phone]  Name: Delaynee Alred MRN: [id number] Date of Birth: [birth year]

## 2021-06-18 ENCOUNTER — Telehealth: Payer: Self-pay | Admitting: *Deleted

## 2021-06-18 NOTE — Telephone Encounter (Signed)
Patient given appointment information for scheduled appointment on 07/10/2021.

## 2021-07-08 NOTE — Progress Notes (Deleted)
   GYNECOLOGY OFFICE VISIT NOTE  History:   Jamie Manning is a 31 y.o. G*** here today for "pain in her right ovary".   ***  She denies any abnormal vaginal discharge, bleeding, or other concerns.     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   Health Maintenance:   No pap on chart review - pt reports pap done ***  Review of Systems:  Pertinent items noted in HPI and remainder of comprehensive ROS otherwise negative.  Physical Exam:  There were no vitals taken for this visit. CONSTITUTIONAL: Well-developed, well-nourished female in no acute distress.  HEENT:  Normocephalic, atraumatic. External right and left ear normal. No scleral icterus.  NECK: Normal range of motion, supple, no masses noted on observation SKIN: No rash noted. Not diaphoretic. No erythema. No pallor. MUSCULOSKELETAL: Normal range of motion. No edema noted. NEUROLOGIC: Alert and oriented to person, place, and time. Normal muscle tone coordination. No cranial nerve deficit noted. PSYCHIATRIC: Normal mood and affect. Normal behavior. Normal judgment and thought content.  CARDIOVASCULAR: Normal heart rate noted RESPIRATORY: Effort and breath sounds normal, no problems with respiration noted ABDOMEN: No masses noted. No other overt distention noted.    PELVIC: {Blank single:19197::"Deferred","Normal appearing external genitalia; normal urethral meatus; normal appearing vaginal mucosa  and cervix.  No abnormal discharge noted.  Normal uterine size, no other palpable masses, no uterine or adnexal tenderness. Performed in the presence of a chaperone"}  Labs and Imaging No results found for this or any previous visit (from the past 168 hour(s)). No results found.  Assessment and Plan:   1. Right lower quadrant abdominal pain - Reviewed multiple possible etiologies - She is s/p tubal ligation - Will check TVUS. If negative, would recommend checking for other etiologies. ***    Diagnoses and all orders for this visit:  Right lower quadrant abdominal pain    Routine preventative health maintenance measures emphasized. Please refer to After Visit Summary for other counseling recommendations.   No follow-ups on file.  Milas Hock, MD, FACOG Obstetrician & Gynecologist, [facility] for [facility], [facility]

## 2021-07-10 ENCOUNTER — Encounter: Admitting: Obstetrics and Gynecology

## 2021-07-10 DIAGNOSIS — R1031 Right lower quadrant pain: Secondary | ICD-10-CM

## 2021-07-16 ENCOUNTER — Encounter (INDEPENDENT_AMBULATORY_CARE_PROVIDER_SITE_OTHER): Payer: Self-pay

## 2021-07-16 DIAGNOSIS — Z0289 Encounter for other administrative examinations: Secondary | ICD-10-CM

## 2021-07-21 ENCOUNTER — Telehealth: Payer: Self-pay | Admitting: General Practice

## 2021-07-21 NOTE — Telephone Encounter (Signed)
Transition Care Management Unsuccessful Follow-up Telephone Call  Date of discharge and from where:  07/20/21 from [facility]  Attempts:  1st Attempt  Reason for unsuccessful TCM follow-up call:  Left voice message

## 2021-07-22 NOTE — Telephone Encounter (Signed)
Transition Care Management Unsuccessful Follow-up Telephone Call  Date of discharge and from where:  07/20/21 from [facility]  Attempts:  2nd Attempt  Reason for unsuccessful TCM follow-up call:  Left voice message

## 2021-07-23 NOTE — Telephone Encounter (Signed)
Transition Care Management Unsuccessful Follow-up Telephone Call  Date of discharge and from where:  07/20/21 from [facility]  Attempts:  3rd Attempt  Reason for unsuccessful TCM follow-up call:  Left voice message

## 2021-08-04 ENCOUNTER — Encounter (INDEPENDENT_AMBULATORY_CARE_PROVIDER_SITE_OTHER): Payer: Self-pay | Admitting: Family Medicine

## 2021-08-04 ENCOUNTER — Ambulatory Visit (INDEPENDENT_AMBULATORY_CARE_PROVIDER_SITE_OTHER): Admitting: Family Medicine

## 2021-08-04 VITALS — BP 102/69 | HR 78 | Temp 98.4°F | Ht 62.0 in | Wt 229.0 lb

## 2021-08-04 DIAGNOSIS — E7849 Other hyperlipidemia: Secondary | ICD-10-CM

## 2021-08-04 DIAGNOSIS — Z1331 Encounter for screening for depression: Secondary | ICD-10-CM

## 2021-08-04 DIAGNOSIS — R5383 Other fatigue: Secondary | ICD-10-CM | POA: Diagnosis not present

## 2021-08-04 DIAGNOSIS — Z8659 Personal history of other mental and behavioral disorders: Secondary | ICD-10-CM | POA: Diagnosis not present

## 2021-08-04 DIAGNOSIS — E785 Hyperlipidemia, unspecified: Secondary | ICD-10-CM

## 2021-08-04 DIAGNOSIS — R0602 Shortness of breath: Secondary | ICD-10-CM | POA: Diagnosis not present

## 2021-08-04 DIAGNOSIS — Z6841 Body Mass Index (BMI) 40.0 and over, adult: Secondary | ICD-10-CM

## 2021-08-05 ENCOUNTER — Encounter (INDEPENDENT_AMBULATORY_CARE_PROVIDER_SITE_OTHER): Payer: Self-pay | Admitting: Family Medicine

## 2021-08-05 DIAGNOSIS — E559 Vitamin D deficiency, unspecified: Secondary | ICD-10-CM | POA: Insufficient documentation

## 2021-08-09 LAB — INSULIN, FREE AND TOTAL
Free Insulin: 12 uU/mL
Total Insulin: 12 uU/mL

## 2021-08-09 LAB — LIPID PANEL
Chol/HDL Ratio: 3.9 ratio (ref 0.0–4.4)
Cholesterol, Total: 208 mg/dL — ABNORMAL HIGH (ref 100–199)
HDL: 54 mg/dL (ref >39)
LDL Chol Calc (NIH): 138 mg/dL — ABNORMAL HIGH (ref 0–99)
Triglycerides: 92 mg/dL (ref 0–149)
VLDL Cholesterol Cal: 16 mg/dL (ref 5–40)

## 2021-08-09 LAB — VITAMIN D 25 HYDROXY (VIT D DEFICIENCY, FRACTURES): Vit D, 25-Hydroxy: 8.5 ng/mL — ABNORMAL LOW (ref 30.0–100.0)

## 2021-08-09 LAB — VITAMIN B12: Vitamin B-12: 478 pg/mL (ref 232–1245)

## 2021-08-12 NOTE — Progress Notes (Unsigned)
Chief Complaint:   OBESITY Jamie Manning (MR# [id number]) is a 31 y.o. female who presents for evaluation and treatment of obesity and related comorbidities. Current BMI is Body mass index is 41.88 kg/m. Jamie Manning has been struggling with her weight for many years and has been unsuccessful in either losing weight, maintaining weight loss, or reaching her healthy weight goal.  Jamie Manning gained most of her weight with her 3rd pregnancy 3 years ago. Craves more starches. 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 Poor sleep at night. Husband loves sweets, and she is a picky eater. Lives with her husband and 3 kids. Eats few veggies, and she denies intake of sugar sweetened beverages. Has a history of bulimia in her teenage years. Usually skips breakfast. Makes lunch at home and has cut back on fast food.   Jamie Manning is currently in the action stage of change and ready to dedicate time achieving and maintaining a healthier weight. Jamie Manning is interested in becoming our patient and working on intensive lifestyle modifications including (but not limited to) diet and exercise for weight loss.  Jamie Manning's habits were reviewed today and are as follows: Her family eats meals together, she thinks her family will eat healthier with her, her desired weight loss is 64 lbs, she has been heavy most of her life, she started gaining weight after 2014, her heaviest weight ever was 250 pounds, she is a picky eater and doesn't like to eat healthier foods, she has significant food cravings issues, she skips meals frequently, she frequently makes poor food choices, and she struggles with emotional eating.  Depression Screen Jamie Manning's Food and Mood (modified PHQ-9) score was 13.     08/04/2021    8:12 AM  Depression screen PHQ 2/9  Decreased Interest 1  Down, Depressed, Hopeless 1  PHQ - 2 Score 2  Altered sleeping 3  Tired, decreased energy 2  Change in appetite 2  Feeling bad or failure about yourself  1   Trouble concentrating 3  Moving slowly or fidgety/restless 0  Suicidal thoughts 0  PHQ-9 Score 13  Difficult doing work/chores Somewhat difficult   Subjective:   1. Other fatigue Jamie Manning admits to daytime somnolence and admits to waking up still tired. Patient has a history of symptoms of daytime fatigue, morning fatigue, and morning headache. Jamie Manning generally gets 4 or 5 hours of sleep per night, and states that she has nightime awakenings. Snoring is present. Apneic episodes are not present. Epworth Sleepiness Score is 11.   2. SOB (shortness of breath) Jamie Manning notes increasing shortness of breath with exercising and seems to be worsening over time with weight gain. She notes getting out of breath sooner with activity than she used to. This has not gotten worse recently. Jamie Manning denies shortness of breath at rest or orthopnea.  3. Other hyperlipidemia We reviewed FLP results from June 2022, LDL 151. Jamie Manning has a family history of hyperlipidemia. She is not on medications. She has decreased her intake of fried foods.    4. History of depression Jamie Manning' PHQ-9 score is 13. Complains of moderate depression. Never needed medications. Lacks a good support system.   Assessment/Plan:   1. Other fatigue Jamie Manning does feel that her weight is causing her energy to be lower than it should be. Fatigue may be related to obesity, depression or many other causes. Labs will be ordered, and in the meanwhile, Jamie Manning will focus on self care including making healthy food choices, increasing physical activity and  focusing on stress reduction.  - EKG 12-Lead - VITAMIN D 25 Hydroxy (Vit-D Deficiency, Fractures) - Lipid panel - Vitamin B12 - Insulin, Free and Total  2. SOB (shortness of breath) Jamie Manning does feel that she gets out of breath more easily that she used to when she exercises. Jamie Manning's shortness of breath appears to be obesity related and exercise induced. She has agreed to work on weight  loss and gradually increase exercise to treat her exercise induced shortness of breath. Will continue to monitor closely.  3. Other hyperlipidemia We will check labs today, and we will follow-up at [facility]' next office visit.   4. History of depression Referral to Dr. Dewaine Conger for cognitive behaviors therapy was made.   5. Depression screening Jamie Manning had a positive depression screening. Depression is commonly associated with obesity and often results in emotional eating behaviors. We will monitor this closely and work on CBT to help improve the non-hunger eating patterns. Referral to Psychology may be required if no improvement is seen as she continues in our clinic.  6. Class 3 severe obesity with serious comorbidity and body mass index (BMI) of 40.0 to 44.9 in adult, unspecified obesity type (HCC) Jamie Manning is currently in the action stage of change and her goal is to continue with weight loss efforts. I recommend Jamie Manning begin the structured treatment plan as follows:  She has agreed to the Category 1 Plan and keeping a food journal and adhering to recommended goals of 1000-1200 calories and 80 grams of protein daily.   EKG-normal sinus rhythm. Obtained labs, reviewed A1c, TSH, CMP, CBC from 03/24/2021. Reviewed IC results.  Exercise goals: Plan to start using home exercise equipment.    Behavioral modification strategies: increasing water intake.  She was informed of the importance of frequent follow-up visits to maximize her success with intensive lifestyle modifications for her multiple health conditions. She was informed we would discuss her lab results at her next visit unless there is a critical issue that needs to be addressed sooner. Jamie Manning agreed to keep her next visit at the agreed upon time to discuss these results.  Objective:   Blood pressure 102/69, pulse 78, temperature 98.4 F (36.9 C), height 5\' 2"  (1.575 m), weight 229 lb (103.9 kg), SpO2 99 %. Body mass index is 41.88  kg/m.  EKG: Normal sinus rhythm, rate 67 BPM.  Indirect Calorimeter completed today shows a VO2 of 229 and a REE of 1584.  Her calculated basal metabolic rate is thus her basal metabolic rate is worse than expected.  General: Cooperative, alert, well developed, in no acute distress. HEENT: Conjunctivae and lids unremarkable. Cardiovascular: Regular rhythm.  Lungs: Normal work of breathing. Neurologic: No focal deficits.   Lab Results  Component Value Date   CREATININE 0.58 03/24/2021   BUN 6 (L) 03/24/2021   NA 139 03/24/2021   K 4.4 03/24/2021   CL 104 03/24/2021   CO2 28 03/24/2021   Lab Results  Component Value Date   ALT 11 03/24/2021   AST 14 03/24/2021   BILITOT 0.5 03/24/2021   Lab Results  Component Value Date   HGBA1C 5.4 03/24/2021   No results found for: "INSULIN" Lab Results  Component Value Date   TSH 1.27 03/24/2021   Lab Results  Component Value Date   CHOL 208 (H) 08/04/2021   HDL 54 08/04/2021   LDLCALC 138 (H) 08/04/2021   TRIG 92 08/04/2021   CHOLHDL 3.9 08/04/2021   Lab Results  Component Value Date  WBC 8.7 03/24/2021   HGB 12.7 03/24/2021   HCT 38.8 03/24/2021   MCV 86.8 03/24/2021   PLT 322 03/24/2021   No results found for: "IRON", "TIBC", "FERRITIN"  Attestation Statements:   Reviewed by clinician on day of visit: allergies, medications, problem list, medical history, surgical history, family history, social history, and previous encounter notes.  Trude Mcburney, am acting as transcriptionist for Seymour Bars, DO.  I have reviewed the above documentation for accuracy and completeness, and I agree with the above. Glennis Brink, DO

## 2021-08-13 DIAGNOSIS — E785 Hyperlipidemia, unspecified: Secondary | ICD-10-CM | POA: Insufficient documentation

## 2021-08-18 ENCOUNTER — Encounter (INDEPENDENT_AMBULATORY_CARE_PROVIDER_SITE_OTHER): Payer: Self-pay | Admitting: Family Medicine

## 2021-08-18 ENCOUNTER — Ambulatory Visit (INDEPENDENT_AMBULATORY_CARE_PROVIDER_SITE_OTHER): Admitting: Family Medicine

## 2021-08-18 VITALS — BP 131/88 | HR 72 | Temp 98.3°F | Ht 62.0 in | Wt 223.0 lb

## 2021-08-18 DIAGNOSIS — Z6841 Body Mass Index (BMI) 40.0 and over, adult: Secondary | ICD-10-CM

## 2021-08-18 DIAGNOSIS — E669 Obesity, unspecified: Secondary | ICD-10-CM | POA: Diagnosis not present

## 2021-08-18 DIAGNOSIS — E7849 Other hyperlipidemia: Secondary | ICD-10-CM

## 2021-08-18 DIAGNOSIS — E559 Vitamin D deficiency, unspecified: Secondary | ICD-10-CM | POA: Diagnosis not present

## 2021-08-18 DIAGNOSIS — R0602 Shortness of breath: Secondary | ICD-10-CM

## 2021-08-18 DIAGNOSIS — F3289 Other specified depressive episodes: Secondary | ICD-10-CM | POA: Diagnosis not present

## 2021-08-18 MED ORDER — VITAMIN D (ERGOCALCIFEROL) 1.25 MG (50000 UNIT) PO CAPS: ORAL_CAPSULE | ORAL | 0 refills | Status: DC

## 2021-08-18 NOTE — Progress Notes (Signed)
Office: [phone]  /  Fax: [phone]    Date: 08/26/2021   Appointment Start Time: 9:05am Duration: 42 minutes Provider: Lawerance Manning, Psy.Jamie Manning. Type of Session: Intake for Individual Therapy  Location of Patient: Home (private location) Location of Provider: Provider's home (private office) Type of Contact: Telepsychological Visit via MyChart Video Visit  Informed Consent: Prior to proceeding with today's appointment, two pieces of identifying information were obtained. In addition, Jamie Manning's physical location at the time of this appointment was obtained as well a phone number she could be reached at in the event of technical difficulties. Jamie Manning and this provider participated in today's telepsychological service.   The provider's role was explained to [name]. The provider reviewed and discussed issues of confidentiality, privacy, and limits therein (e.g., reporting obligations). In addition to verbal informed consent, written informed consent for psychological services was obtained prior to the initial appointment. Since the clinic is not a 24/7 crisis center, mental health emergency resources were shared and this  provider explained MyChart, e-mail, voicemail, and/or other messaging systems should be utilized only for non-emergency reasons. This provider also explained that information obtained during appointments will be placed in Jamie Manning's medical record and relevant information will be shared with other providers at [facility] for coordination of care. Jamie Manning agreed information may be shared with other [facility] providers as needed for coordination of care and by signing the service agreement document, she provided written consent for coordination of care. Prior to initiating telepsychological services, Jamie Manning completed an informed consent document, which included the development of a safety plan (i.e., an emergency contact and emergency resources)  in the event of an emergency/crisis. Jamie Manning verbally acknowledged understanding she is ultimately responsible for understanding her insurance benefits for telepsychological and in-person services. This provider also reviewed confidentiality, as it relates to telepsychological services. Jamie Manning  acknowledged understanding that appointments cannot be recorded without both party consent and she is aware she is responsible for securing confidentiality on her end of the session. Jamie Manning verbally consented to proceed.  Chief Complaint/HPI: Jamie Manning was referred by Dr. Seymour Bars due to  "history of depression" . Per the note for the initial visit with Dr. Seymour Bars on 08/04/2021, "Jamie Manning' PHQ-9 score is 13. Complains of moderate depression. Never needed medications. Lacks a good support system. " The note for the initial appointment further indicated the following: "Jamie Manning's habits were reviewed today and are as follows: Her family eats meals together, she thinks her family will eat healthier with her, her desired weight loss is 64 lbs, she has been heavy most of her life, she started gaining weight after 2014, her heaviest weight ever was 250 pounds, she is a picky eater and doesn't like to eat healthier foods, she has significant food cravings issues, she skips meals frequently, she frequently makes poor food choices, and she struggles with emotional eating." Jamie Manning's Food and Mood (modified PHQ-9) score on 08/04/2021 was 13.  During today's appointment, Jamie Manning reported a history of using food "as a comfort." She noted, "I've kind of broken out of that," adding, "I've been actively working on it." She explained she limits accessibility to trigger foods (e.g., crackers and cheese) and has increased awareness. She was verbally administered a questionnaire assessing various behaviors related to emotional eating behaviors. Jamie Manning endorsed the following: eat certain foods when you are anxious, stressed, depressed,  or your feelings are hurt, use food to help you cope with emotional situations, find food is comforting to you, overeat  when you are worried about something, not worry about what you eat when you are in a good mood, and overeat when you are alone, but eat much less when you are with other people. She described the current frequency of emotional eating behaviors as "few times a week." Jamie Manning reported a history of bulimia during her teenage years, and a history of significantly restricting food intake for weight loss. She cannot recall who diagnosed her with "bulimia." Jamie Manning clarified she last engaged in binge eating, purging, and restricting food intake behaviors approximately 17 years. She denied a history of treatment for disordered eating behaviors, adding her father would "buy [her] a bunch of self-help books." Furthermore, Jamie Manning denied other problems of concern.    Mental Status Examination:  Appearance: neat Behavior: appropriate to circumstances Mood: neutral Affect: mood congruent Speech: WNL Eye Contact: appropriate Psychomotor Activity: WNL Gait: unable to assess  Thought Process: linear, logical, and goal directed and denies suicidal, homicidal, and self-harm ideation, plan and intent  Thought Content/Perception: no hallucinations, delusions, bizarre thinking or behavior endorsed or observed Orientation: AAOx4 Memory/Concentration: memory, attention, language, and fund of knowledge intact  Insight/Judgment: fair  Family & Psychosocial History: Jamie Manning reported she is married and she has three children (ages 28, 54, and 3). She indicated she is currently not working. 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 Additionally, Jamie Manning shared her highest level of education obtained is an associate's degree. Currently, Jamie Manning's social support  system consists of her husband, children, father, and high school best friend. Moreover, Malloree stated she resides with her husband and children.   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     Vitamin Jamie Manning, Ergocalciferol, (DRISDOL) 1.25 MG (50000 UNIT) CAPS capsule Take 1 capsule (50,000 Units total) by mouth 2 (two) times a week for 30 days, THEN 1 capsule (50,000 Units total) 2 (two) times a week. 10 capsule 0   No current facility-administered medications on file prior to visit.  Tauriel shared she is medication compliant.   Mental Health History: Jamie Manning reported she has never attended therapeutic services. She reported a history of Lexapro last summer, noting she did not like the side effects. Jamie Manning reported there is no history of hospitalizations for psychiatric concerns. Jamie Manning denied a family history of mental health/substance abuse related concerns. Furthermore, Jamie Manning recalled her "parents went to jail" when she was young, which reportedly impacted their relationship. She denied a history of psychological, physical , and sexual abuse, as well as neglect.   Jamie Manning described her typical mood lately as "better some days," but expressed experiencing sadness when her husband experiences PTSD-related challenges. She denied any safety concerns. She reported  social withdrawal and "zon[ing] out" due to marital conflict. She also reported  experiencing occasional crying spells when "frustrated." Jamie Manning also discussed having "a lot going on at once," noting her children are in extracurricular activities which keeps her busy. Jamie Manning denied current alcohol use. She denied tobacco use. She denied illicit/recreational substance use. Furthermore, Jamie Manning indicated she is not experiencing the following: hallucinations and delusions, paranoia, symptoms of mania , panic attacks, symptoms of trauma, memory concerns, and obsessions and compulsions. She also denied history of and current suicidal ideation, plan, and intent; history of and current homicidal ideation, plan, and intent; and history of and current engagement in self-harm.  The following strengths were reported by Jamie Manning: "operate well under pressure," "very organized," and "believe in perseverance." The following strengths were observed by this provider: ability to express thoughts and feelings during the therapeutic session, ability to establish and benefit from a therapeutic relationship, willingness to work toward established goal(s) with the clinic and ability to engage in reciprocal conversation.   Legal History: Kitrina reported there is no history of legal involvement.   Structured Assessments Results: The Patient Health Questionnaire-9 (PHQ-9) is a self-report measure that assesses symptoms and severity of depression over the course of the last two weeks. Jamie Manning obtained a score of 9 suggesting mild depression. Jamie Manning finds the endorsed symptoms to be somewhat difficult. [0= Not at all; 1= Several days; 2= More than half the days; 3= Nearly every day] Little interest or pleasure in doing things 0  Feeling down, depressed, or hopeless 1  Trouble falling or staying asleep, or sleeping too much 1  Feeling tired or having little energy 3  Poor appetite or overeating 1  Feeling bad about yourself --- or that you are a failure or have let yourself or your family down 1  Trouble  concentrating on things, such as reading the newspaper or watching television 1  Moving or speaking so slowly that other people could have noticed? Or the opposite --- being so fidgety or restless that you have been moving around a lot more than usual 1  Thoughts that you would be better off dead or hurting yourself in some way 0  PHQ-9 Score 9    The Generalized Anxiety Disorder-7 (GAD-7) is a brief self-report measure that assesses symptoms of anxiety over the course of the last two weeks. Jamie Manning obtained a score of 5 suggesting mild anxiety. Jamie Manning finds the endorsed symptoms to be somewhat difficult. [0= Not at all; 1= Several days; 2= Over half the days; 3= Nearly every day] Feeling nervous, anxious, on edge 1  Not being able to stop or control worrying 0  Worrying too much about different things 1  Trouble relaxing 1  Being so restless that it's hard to sit still 1  Becoming easily annoyed or irritable 1  Feeling afraid as if something awful might happen 0  GAD-7 Score 5   Interventions:  Conducted a chart review Focused on rapport building Verbally administered PHQ-9 and GAD-7 for symptom monitoring Verbally administered Food & Mood questionnaire to assess various behaviors related to emotional eating Provided emphatic reflections and validation Collaborated with patient on a treatment goal  Psychoeducation provided regarding physical versus emotional hunger Recommended/discussed option for longer-term therapeutic services  Diagnostic Impressions & Provisional DSM-5 Diagnosis(es): Aidaly discussed a history of an eating disorder; however, indicated currently she engages in emotional eating behaviors a "few times a week." She also described experiencing depression and anxiety related symptoms secondary to marital conflict  as her husband reportedly meets criteria for PTSD. 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  Plan: Maesyn appears able and willing to participate as evidenced by collaboration on a treatment goal, engagement in reciprocal conversation, and asking questions as needed for clarification. The next appointment is scheduled for 09/09/2021 at 12:30pm, which will be via MyChart Video Visit. The following treatment goal was established: increase coping skills. This provider will regularly review the treatment plan and medical chart to keep informed of status changes. Olanda expressed understanding and agreement with the initial treatment plan of care. Nela will be sent a handout via e-mail to utilize between now and the next appointment to increase awareness of hunger patterns and subsequent eating. Dee provided verbal consent during today's appointment for this provider to send the handout via e-mail. Additionally, Ashani provided verbal consent for this provider to place a referral with [facility] for ongoing stressors/marital conflict. She also provided verbal consent for this provider to e-mail additional referral options.

## 2021-08-20 ENCOUNTER — Encounter (INDEPENDENT_AMBULATORY_CARE_PROVIDER_SITE_OTHER): Payer: Self-pay

## 2021-08-25 NOTE — Progress Notes (Signed)
Chief Complaint:   OBESITY Jamie Manning is here to discuss her progress with her obesity treatment plan along with follow-up of her obesity related diagnoses. Jamie Manning is on the Category 1 Plan and keeping a food journal and adhering to recommended goals of 1000-1200 calories and 80 grams of protein daily and states she is following her eating plan approximately 90% of the time. Jamie Manning states she is doing 0 minutes 0 times per week.  Today's visit was #: 2 Starting weight: 229 lbs Starting date: 07/16/2021 Today's weight: 223 lbs Today's date: 08/18/2021 Total lbs lost to date: 6 Total lbs lost since last in-office visit: 6  Interim History: Jamie Manning is doing well with category 1 meal plan and has good support at home.  She is not meal skipping.  She denies hunger and denies much sugar cravings.  She is eating fruit and veggies.  Has not increased exercise, join [employer].  Motivated to continue working on meal planning and prepping and improving relationship with food.  Subjective:   1. Vitamin D deficiency Jamie Manning's vitamin D level was very low last visit at 8.5.  She complains of fatigue.  I discussed labs with the patient today.  2. Other hyperlipidemia Jamie Manning has a mildly elevated LDL at 138 with no cardiovascular risk factors.  She denies chest pain or DOE. I discussed labs with the patient today.  3. Other depression, with emotional eating  Jamie Manning is scheduled on 08/26/2021 with Jamie Manning.  She has a history of eating disorder in the past.  She is practicing mindful eating.  Assessment/Plan:   1. Vitamin D deficiency Jamie Manning will begin prescription Vitamin D 50,000 IU 2 times per week. We will plan to recheck Vitamin D level in 2-3 months.   - Vitamin D, Ergocalciferol, (DRISDOL) 1.25 MG (50000 UNIT) CAPS capsule; Take 1 capsule (50,000 Units total) by mouth 2 (two) times a week for 30 days, THEN 1 capsule (50,000 Units total) 2 (two) times a week.  Dispense: 10  capsule; Refill: 0  2. Other hyperlipidemia We will recheck FLP in 6 months to look for improvements.   3. Other depression, with emotional eating  Jamie Manning will continues to focus on getting protein fiber with meals a fuel for body. She is to keep her cognitive behavioral therapy visit.   4. Obesity, current BMI 40.8 Jamie Manning is currently in the action stage of change. As such, her goal is to continue with weight loss efforts. She has agreed to the Category 2 Plan and keeping a food journal and adhering to recommended goals of 1200 calories and 80-90 grams of protein daily.   Exercise goals: walk or gym 2 times per week.   Behavioral modification strategies: increasing lean protein intake, increasing vegetables, increasing water intake, decreasing eating out, no skipping meals, better snacking choices, emotional eating strategies, and decreasing junk food.  Jamie Manning has agreed to follow-up with our clinic in 2 weeks. She was informed of the importance of frequent follow-up visits to maximize her success with intensive lifestyle modifications for her multiple health conditions.   Objective:   Blood pressure 131/88, pulse 72, temperature 98.3 F (36.8 C), height 5\' 2"  (1.575 m), weight 223 lb (101.2 kg), SpO2 100 %. Body mass index is 40.79 kg/m.  General: Cooperative, alert, well developed, in no acute distress. HEENT: Conjunctivae and lids unremarkable. Cardiovascular: Regular rhythm.  Lungs: Normal work of breathing. Neurologic: No focal deficits.   Lab Results  Component Value Date  CREATININE 0.58 03/24/2021   BUN 6 (L) 03/24/2021   NA 139 03/24/2021   K 4.4 03/24/2021   CL 104 03/24/2021   CO2 28 03/24/2021   Lab Results  Component Value Date   ALT 11 03/24/2021   AST 14 03/24/2021   BILITOT 0.5 03/24/2021   Lab Results  Component Value Date   HGBA1C 5.4 03/24/2021   No results found for: "INSULIN" Lab Results  Component Value Date   TSH 1.27 03/24/2021   Lab  Results  Component Value Date   CHOL 208 (H) 08/04/2021   HDL 54 08/04/2021   LDLCALC 138 (H) 08/04/2021   TRIG 92 08/04/2021   CHOLHDL 3.9 08/04/2021   Lab Results  Component Value Date   VD25OH 8.5 (L) 08/04/2021   Lab Results  Component Value Date   WBC 8.7 03/24/2021   HGB 12.7 03/24/2021   HCT 38.8 03/24/2021   MCV 86.8 03/24/2021   PLT 322 03/24/2021   No results found for: "IRON", "TIBC", "FERRITIN"  Attestation Statements:   Reviewed by clinician on day of visit: allergies, medications, problem list, medical history, surgical history, family history, social history, and previous encounter notes.   Trude Mcburney, am acting as transcriptionist for Seymour Bars, DO.  I have reviewed the above documentation for accuracy and completeness, and I agree with the above. Glennis Brink, DO

## 2021-08-26 ENCOUNTER — Telehealth (INDEPENDENT_AMBULATORY_CARE_PROVIDER_SITE_OTHER): Admitting: Psychology

## 2021-08-26 DIAGNOSIS — F5089 Other specified eating disorder: Secondary | ICD-10-CM

## 2021-08-26 DIAGNOSIS — F4323 Adjustment disorder with mixed anxiety and depressed mood: Secondary | ICD-10-CM

## 2021-08-29 ENCOUNTER — Telehealth: Payer: Self-pay | Admitting: General Practice

## 2021-08-29 NOTE — Telephone Encounter (Signed)
Transition Care Management Follow-up Telephone Call Date of discharge and from where: 08/ How have you been since you were released from the hospital? Still having some bleeding and pelvic pain. She is scheduled to see the OB in September. Scheduled to see PCP on 09/05/21 Any questions or concerns? No  Items Reviewed: Did the pt receive and understand the discharge instructions provided? Yes  Medications obtained and verified? Yes  Other? No  Any new allergies since your discharge? No  Dietary orders reviewed? Yes Do you have support at home? Yes   Home Care and Equipment/Supplies: Were home health services ordered? no   Functional Questionnaire: (I = Independent and D = Dependent) ADLs: I  Bathing/Dressing- I  Meal Prep- I  Eating- I  Maintaining continence- I  Transferring/Ambulation- I  Managing Meds- I  Follow up appointments reviewed:  PCP Hospital f/u appt confirmed? Yes scheduled for 09/05/21 at 1030 with PCP. Specialist Hospital f/u appt confirmed? Yes  Scheduled to see OBGYN on 09/25/21. Are transportation arrangements needed? No  If their condition worsens, is the pt aware to call PCP or go to the Emergency Dept.? Yes Was the patient provided with contact information for the PCP's office or ED? Yes Was to pt encouraged to call back with questions or concerns? Yes

## 2021-09-01 ENCOUNTER — Ambulatory Visit (INDEPENDENT_AMBULATORY_CARE_PROVIDER_SITE_OTHER): Admitting: Family Medicine

## 2021-09-01 ENCOUNTER — Encounter (INDEPENDENT_AMBULATORY_CARE_PROVIDER_SITE_OTHER): Payer: Self-pay | Admitting: Family Medicine

## 2021-09-01 VITALS — BP 121/79 | HR 74 | Temp 98.3°F | Ht 62.0 in | Wt 219.0 lb

## 2021-09-01 DIAGNOSIS — Z8659 Personal history of other mental and behavioral disorders: Secondary | ICD-10-CM

## 2021-09-01 DIAGNOSIS — E669 Obesity, unspecified: Secondary | ICD-10-CM | POA: Diagnosis not present

## 2021-09-01 DIAGNOSIS — Z6841 Body Mass Index (BMI) 40.0 and over, adult: Secondary | ICD-10-CM | POA: Diagnosis not present

## 2021-09-01 DIAGNOSIS — E559 Vitamin D deficiency, unspecified: Secondary | ICD-10-CM

## 2021-09-01 MED ORDER — VITAMIN D (ERGOCALCIFEROL) 1.25 MG (50000 UNIT) PO CAPS: ORAL_CAPSULE | ORAL | 0 refills | Status: AC

## 2021-09-05 ENCOUNTER — Inpatient Hospital Stay: Admitting: Medical-Surgical

## 2021-09-07 NOTE — Progress Notes (Unsigned)
Chief Complaint:   OBESITY Jamie Manning is here to discuss her progress with her obesity treatment plan along with follow-up of her obesity related diagnoses. Jamie Manning is on Category 2 Plan and keeping a food journal and adhering to recommended goals of 1200 calories and 80-90 grams of protein daily. and states she is following her eating plan approximately 80% of the time. Jamie Manning states she is using the squat machine, jumping rope 10 minutes 3 times per week.  Today's visit was #: 3 Starting weight: 229 LBS Starting date: 07/16/2021 Today's weight: 219 lbs Today's date: 09/01/2021 Total lbs lost to date: 10 lbs Total lbs lost since last in-office visit: 4 lbs  Interim History: Has reduced portion sizes.  Denies much hunger, rare cravings, eating fruit.  Increased water intake.  No meal skipping.  Started jump rope, going through marriage separation.   Subjective:   1. Vitamin D deficiency She is currently taking prescription vitamin D 50,000 IU each week. She denies nausea, vomiting or muscle weakness. Energy level is improving.  Due for a recheck of labs in 2 months.   2. History of eating disorder Seeing Dr Dewaine Conger for CBT.  Doing well with mindful eating.   Assessment/Plan:   1. Vitamin D deficiency Refill - Vitamin D, Ergocalciferol, (DRISDOL) 1.25 MG (50000 UNIT) CAPS capsule; Take 1 capsule (50,000 Units total) by mouth 2 (two) times a week for 30 days, THEN 1 capsule (50,000 Units total) 2 (two) times a week.  Dispense: 10 capsule; Refill: 0  2. History of eating disorder Continue CBT with Dr Dewaine Conger.   3. Obesity, current BMI 40.1 Jamie Manning is currently in the action stage of change. As such, her goal is to continue with weight loss efforts. She has agreed to the Category 2 Plan.   Exercise goals:  jump rope or gym 3-4 times weekly.   Behavioral modification strategies: increasing lean protein intake, increasing water intake, decreasing eating out, no skipping meals,  better snacking choices, and decreasing junk food.  Jamie Manning has agreed to follow-up with our clinic in 3 weeks. She was informed of the importance of frequent follow-up visits to maximize her success with intensive lifestyle modifications for her multiple health conditions.   Objective:   Blood pressure 121/79, pulse 74, temperature 98.3 F (36.8 C), height 5\' 2"  (1.575 m), weight 219 lb (99.3 kg), SpO2 99 %. Body mass index is 40.06 kg/m.  General: Cooperative, alert, well developed, in no acute distress. HEENT: Conjunctivae and lids unremarkable. Cardiovascular: Regular rhythm.  Lungs: Normal work of breathing. Neurologic: No focal deficits.   Lab Results  Component Value Date   CREATININE 0.58 03/24/2021   BUN 6 (L) 03/24/2021   NA 139 03/24/2021   K 4.4 03/24/2021   CL 104 03/24/2021   CO2 28 03/24/2021   Lab Results  Component Value Date   ALT 11 03/24/2021   AST 14 03/24/2021   BILITOT 0.5 03/24/2021   Lab Results  Component Value Date   HGBA1C 5.4 03/24/2021   No results found for: "INSULIN" Lab Results  Component Value Date   TSH 1.27 03/24/2021   Lab Results  Component Value Date   CHOL 208 (H) 08/04/2021   HDL 54 08/04/2021   LDLCALC 138 (H) 08/04/2021   TRIG 92 08/04/2021   CHOLHDL 3.9 08/04/2021   Lab Results  Component Value Date   VD25OH 8.5 (L) 08/04/2021   Lab Results  Component Value Date   WBC 8.7 03/24/2021  HGB 12.7 03/24/2021   HCT 38.8 03/24/2021   MCV 86.8 03/24/2021   PLT 322 03/24/2021   No results found for: "IRON", "TIBC", "FERRITIN"  Attestation Statements:   Reviewed by clinician on day of visit: allergies, medications, problem list, medical history, surgical history, family history, social history, and previous encounter notes.  I, Malcolm Metro, am acting as Energy manager for Seymour Bars, DO.  I have reviewed the above documentation for accuracy and completeness, and I agree with the above. Glennis Brink, DO

## 2021-09-09 ENCOUNTER — Telehealth (INDEPENDENT_AMBULATORY_CARE_PROVIDER_SITE_OTHER): Admitting: Psychology

## 2021-09-09 ENCOUNTER — Telehealth (INDEPENDENT_AMBULATORY_CARE_PROVIDER_SITE_OTHER): Payer: Self-pay | Admitting: Psychology

## 2021-09-09 NOTE — Telephone Encounter (Signed)
  Office: [phone]  /  Fax: [phone]  Date of Call: September 09, 2021  Time of Call: 12:32pm Provider: Lawerance Cruel, PsyD  CONTENT: This provider called Jamie Manning to check-in as she did not present for today's MyChart Video Visit appointment at 12:30pm. A HIPAA compliant voicemail was left requesting a call back. Of note, this provider stayed on the MyChart Video Visit appointment for 5 minutes prior to signing off per the clinic's grace period policy.    PLAN: This provider will wait for [name] to call back. No further follow-up planned by this provider.

## 2021-09-09 NOTE — Progress Notes (Unsigned)
  Office: [phone]  /  Fax: [phone]    Date: September 09, 2021    Appointment Start Time: *** Duration: *** minutes Provider: Lawerance Cruel, Psy.D. Type of Session: Individual Therapy  Location of Patient: {gbptloc:23249} (private location) Location of Provider: Provider's Home (private office) Type of Contact: Telepsychological Visit via MyChart Video Visit  Session Content: This provider called Jamie Manning at 12:32pm as she did not present for today's appointment. A HIPAA compliant voicemail was left requesting a call back.  As such, today's appointment was initiated *** minutes late.[name] is a 31 y.o. female presenting for a follow-up appointment to address the previously established treatment goal of increasing coping skills.Today's appointment was a telepsychological visit. [name] provided verbal consent for today's telepsychological appointment and she is aware she is responsible for securing confidentiality on her end of the session. Prior to proceeding with today's appointment, [name]'s physical location at the time of this appointment was obtained as well a phone number she could be reached at in the event of technical difficulties. Perry and this provider participated in today's telepsychological service.   This provider conducted a brief check-in. *** [name] was receptive to today's appointment as evidenced by openness to sharing, responsiveness to feedback, and {gbreceptiveness:23401}.  Mental Status Examination:  Appearance: {Appearance:22431} Behavior: {Behavior:22445} Mood: {gbmood:21757} Affect: {Affect:22436} Speech: {Speech:22432} Eye Contact: {Eye Contact:22433} Psychomotor Activity: {Motor Activity:22434} Gait: {gbgait:23404} Thought Process: {thought process:22448}  Thought Content/Perception: {disturbances:22451} Orientation: {Orientation:22437} Memory/Concentration: {gbcognition:22449} Insight: {Insight:22446} Judgment: {Insight:22446}  Interventions:   {Interventions for Progress Notes:23405}  DSM-5 Diagnosis(es):  F50.89 Other Specified Feeding or Eating Disorder, Emotional Eating Behaviors and  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 [name] has demonstrated progress in her goal as evidenced by {gbtxprogress:22839}. [name] also {gbtxprogress2:22951}.  Plan: The next appointment is scheduled for *** at ***, which will be via MyChart Video Visit. The next session will focus on {Plan for Next Appointment:23400}.

## 2021-09-23 ENCOUNTER — Telehealth (INDEPENDENT_AMBULATORY_CARE_PROVIDER_SITE_OTHER): Payer: Self-pay | Admitting: Psychology

## 2021-09-23 ENCOUNTER — Encounter (INDEPENDENT_AMBULATORY_CARE_PROVIDER_SITE_OTHER): Payer: Self-pay

## 2021-09-23 ENCOUNTER — Encounter (INDEPENDENT_AMBULATORY_CARE_PROVIDER_SITE_OTHER): Admitting: Psychology

## 2021-09-23 NOTE — Progress Notes (Signed)
Entered in error

## 2021-09-23 NOTE — Telephone Encounter (Signed)
  Office: [phone]  /  Fax: [phone]  Date of Call: September 23, 2021  Time of Call: 2:35pm Duration of Call: ~1 minute Provider: Lawerance Cruel, PsyD  CONTENT:  This provider called Stellar to check-in as MyChart Video Visit indicated she joined today's appointment and left. She explained she was on the phone with her son's doctor as he reportedly has an infection. She requested to reschedule today's appointment so that she could take her son to the doctor. A brief check-in was completed. Maybel noted, "Everything is going pretty good." She denied any safety concerns. All questions/concerns addressed.   PLAN: Hailea indicated she will call back to reschedule today's appointment. The no show fee will be waived. No further follow-up planned by this provider.

## 2021-09-24 ENCOUNTER — Ambulatory Visit (INDEPENDENT_AMBULATORY_CARE_PROVIDER_SITE_OTHER): Admitting: Family Medicine

## 2022-07-25 IMAGING — DX DG CERVICAL SPINE COMPLETE 4+V
6 series · 6 of 6 positions shown · non-contrast
Comparison: None.

CLINICAL DATA: neck pain with right arm weakness and paresthesias

EXAM:
CERVICAL SPINE - COMPLETE 4+ VIEW

[c-spine lat]
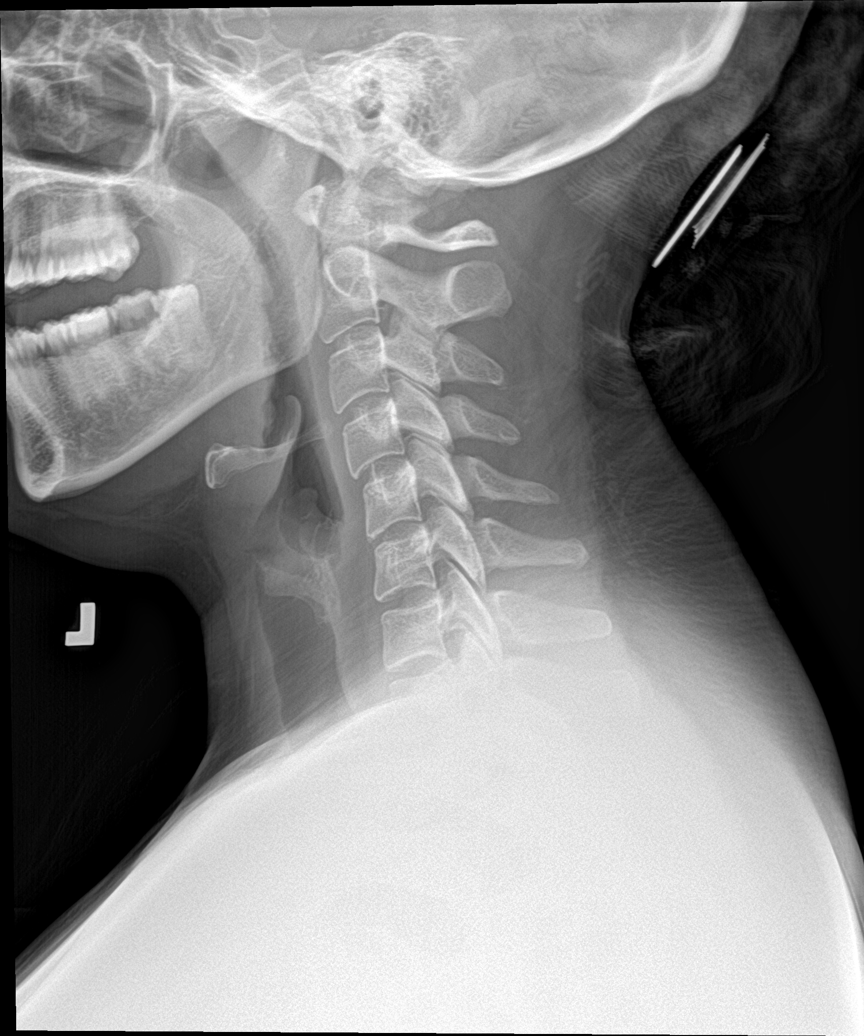

[c-spine obl (1 of 2)]
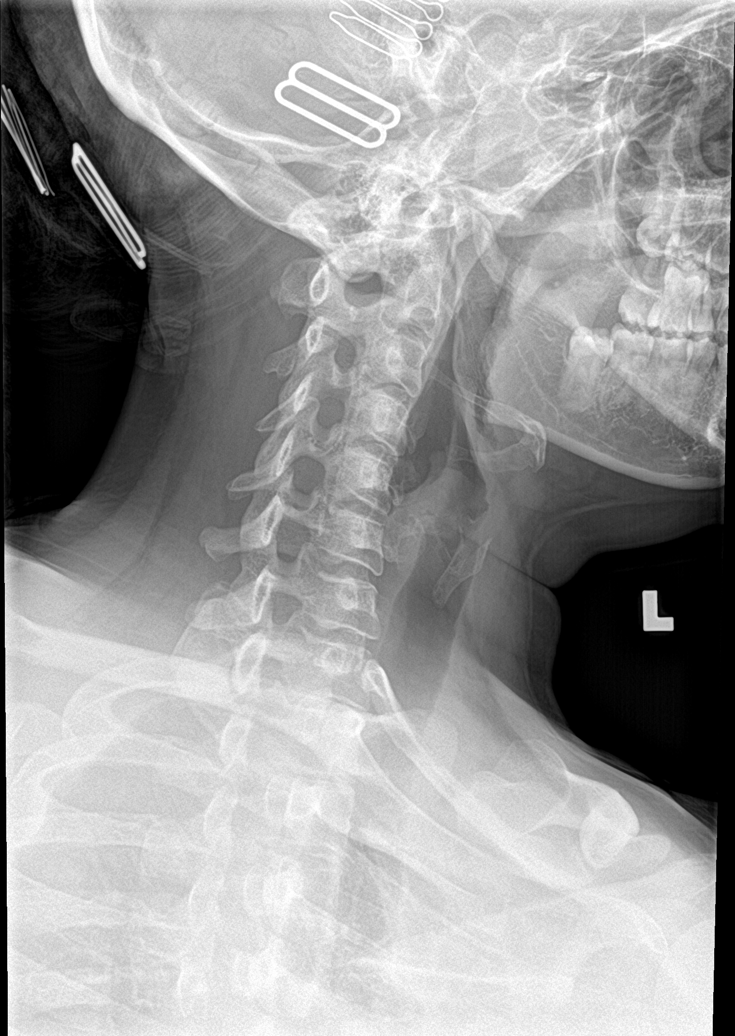

[c-spine obl (2 of 2)]
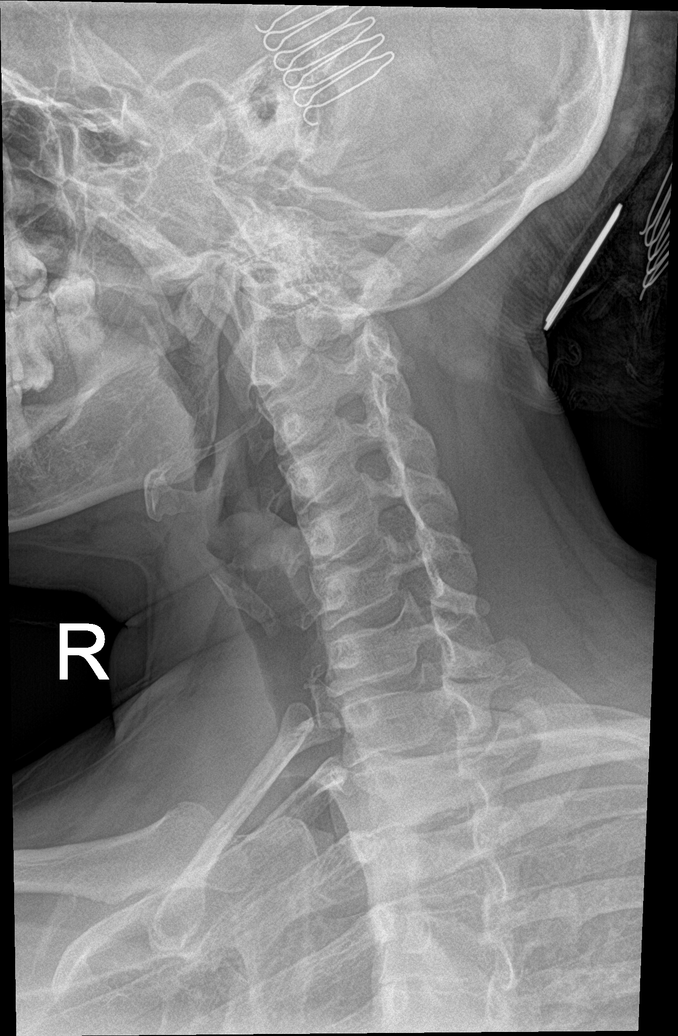

[c-spine ap]
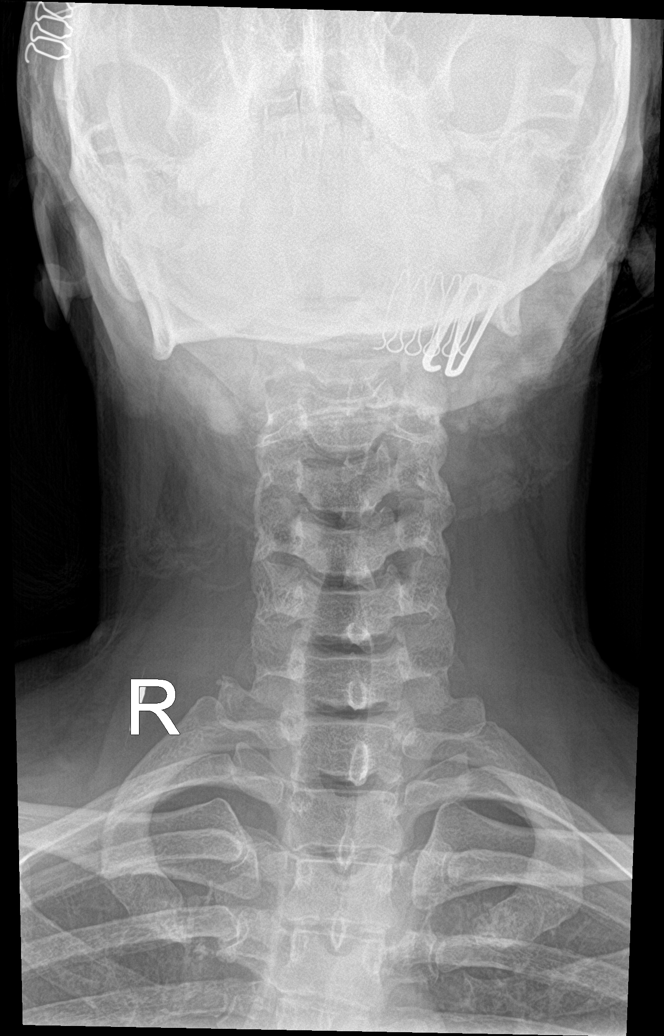

[c-spine open mouth]
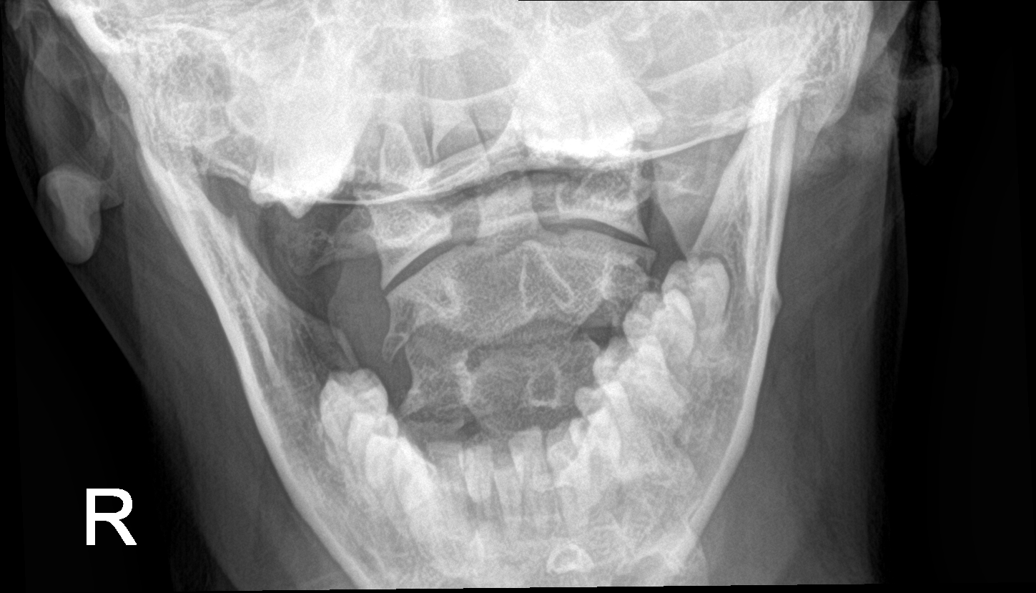

[c-spine swimmers]
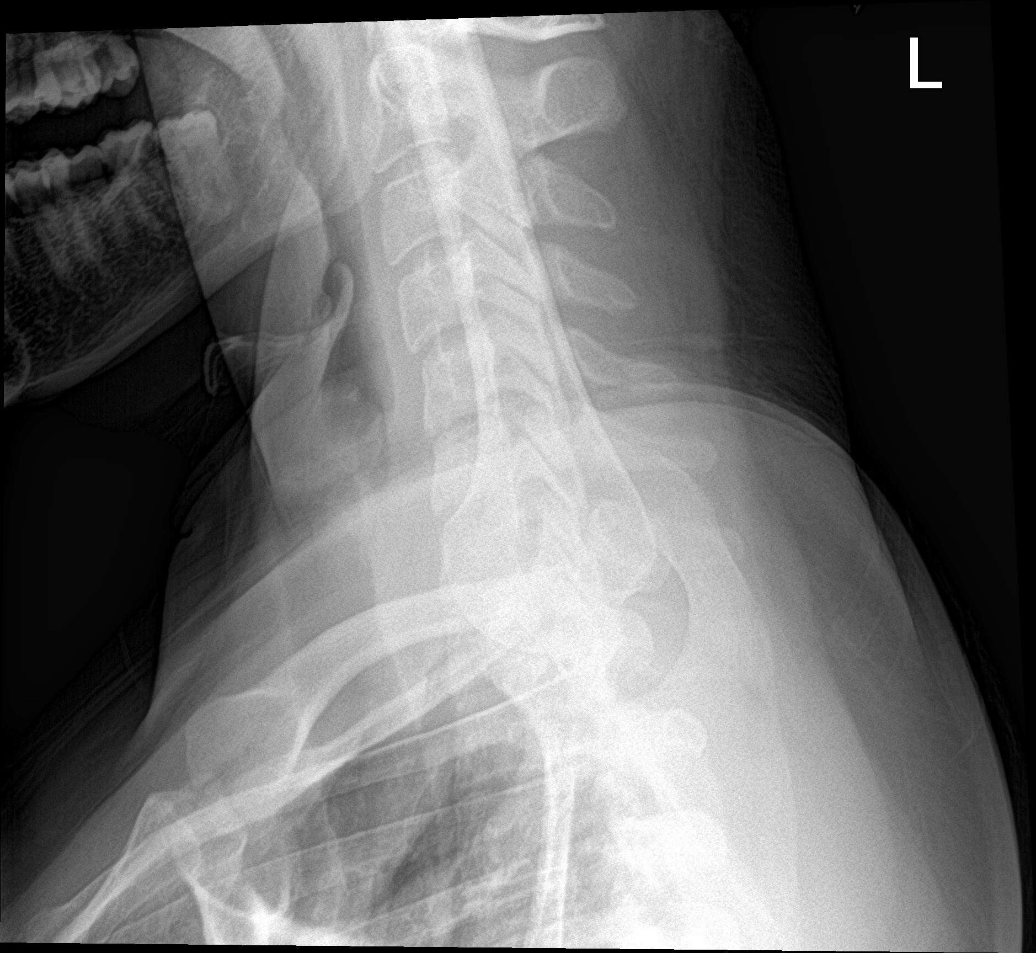

[6 of 6 positions shown; findings below may reference images not displayed]

FINDINGS: The cervical spine is visualized from C1-C7. No
spondylolisthesis.Straightening of the cervical lordosis. Vertebral
body heights are maintained: no evidence of acute fracture.
Intervertebral spaces are maintained without significant
degenerative changes. No prevertebral soft tissue swelling.
Visualized thorax is unremarkable.
IMPRESSION: No radiographically evident acute fracture or static subluxation of
the cervical spine.

If persistent concern for cervical spine pathology, recommend
dedicated cross-sectional imaging.

## 2022-08-01 IMAGING — US US ABDOMEN LIMITED
1 series · 14 of 25 positions shown · non-contrast
Comparison: None.

CLINICAL DATA: Right upper quadrant pain

EXAM:
ULTRASOUND ABDOMEN LIMITED RIGHT UPPER QUADRANT

[Series 1: us abdomen limited ruq (liver/gb) · 14 of 34 slices shown]
[im 1/34]
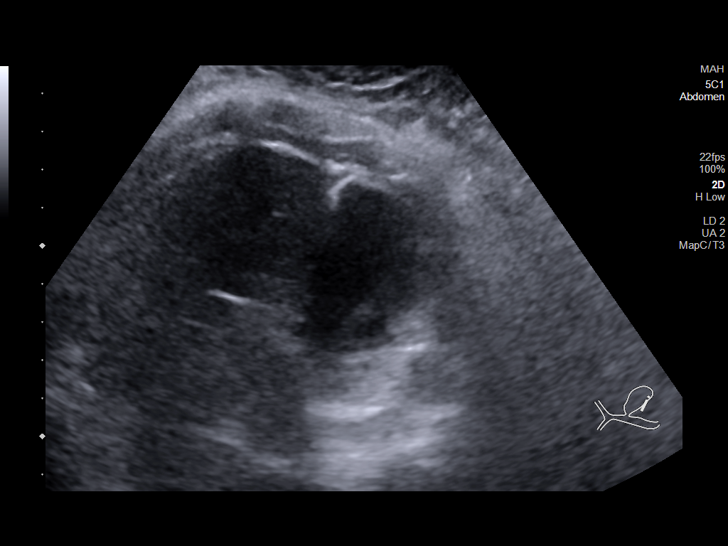
[im 3/34]
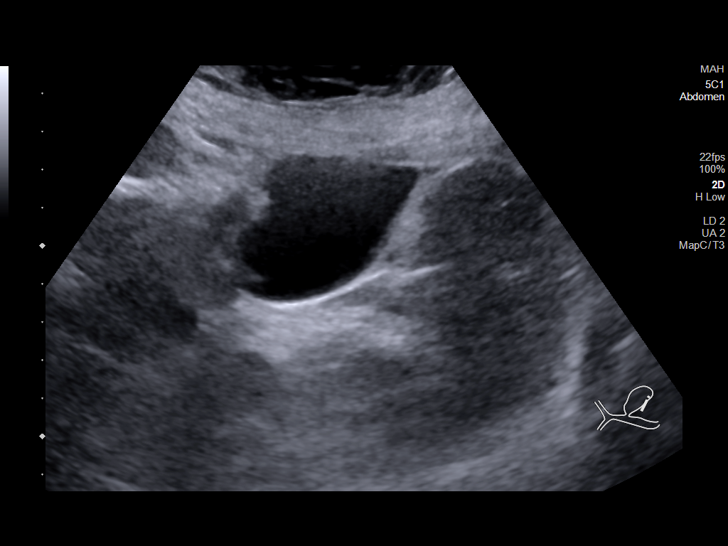
[im 6/34]
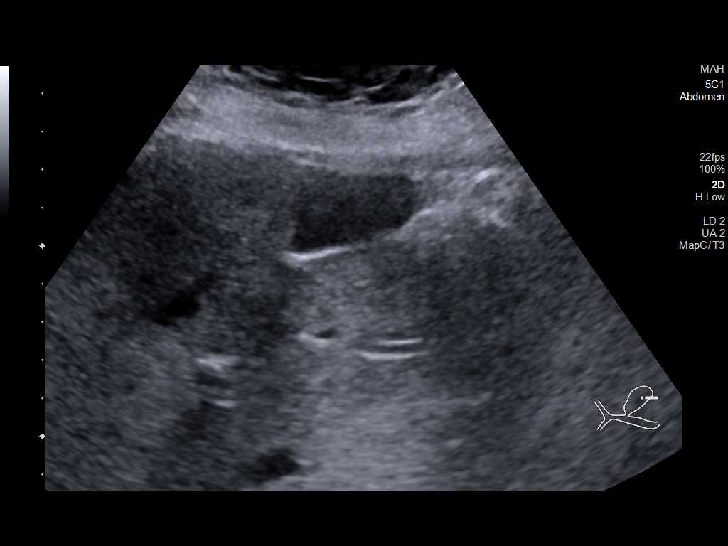
[im 9/34]
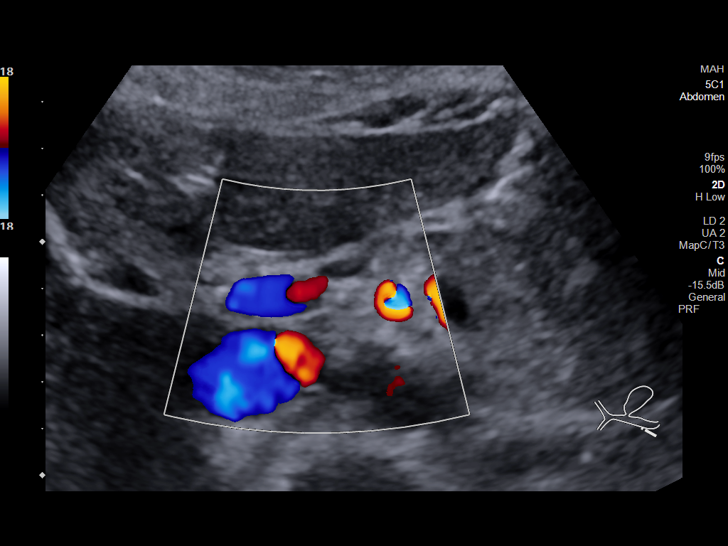
[im 12/34]
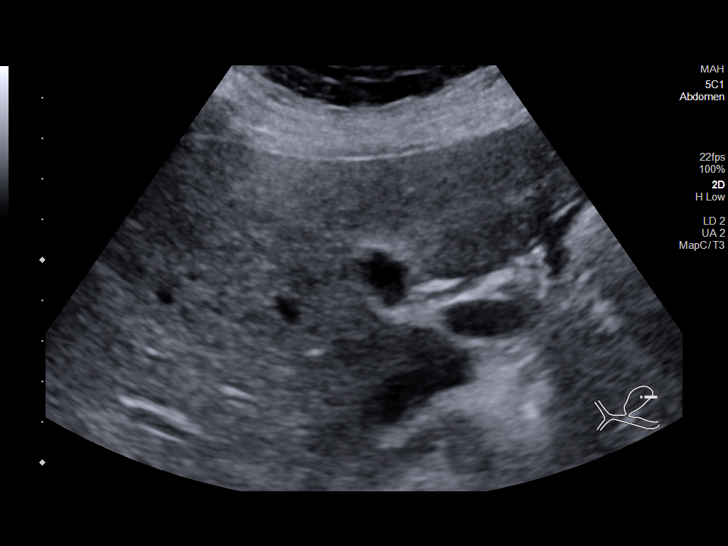
[im 13/34]
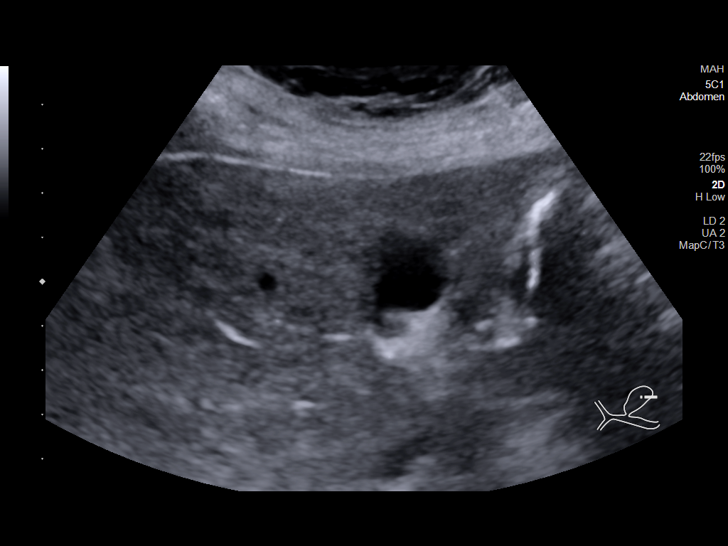
[im 16/34]
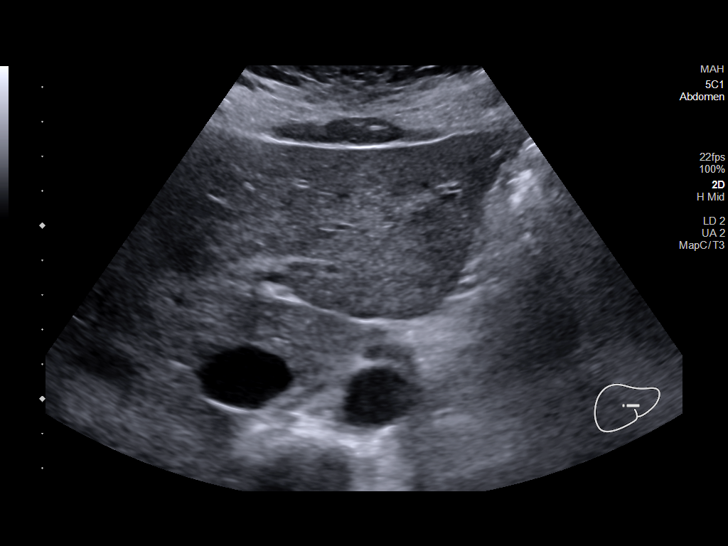
[im 18/34]
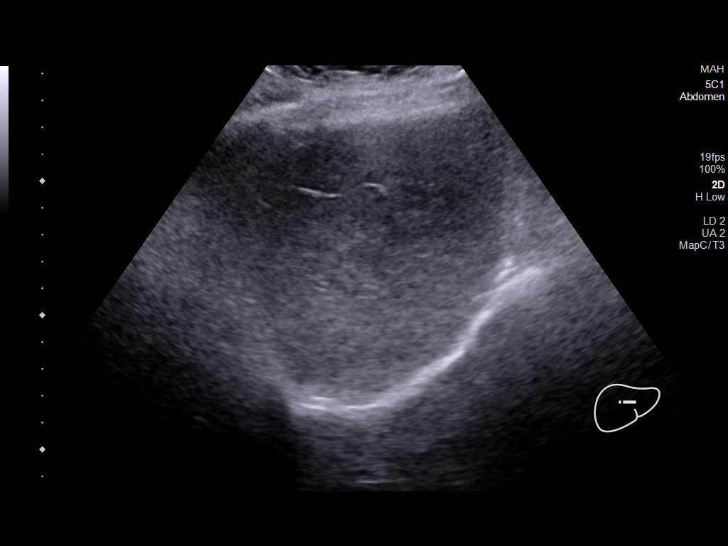
[im 21/34]
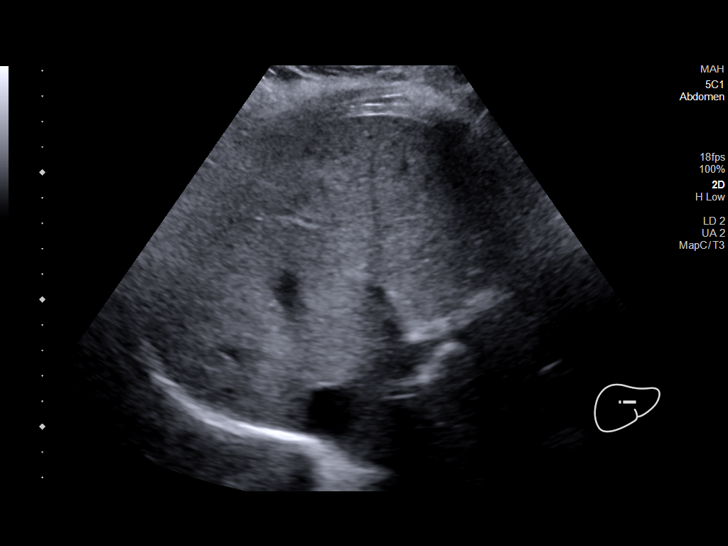
[im 23/34]
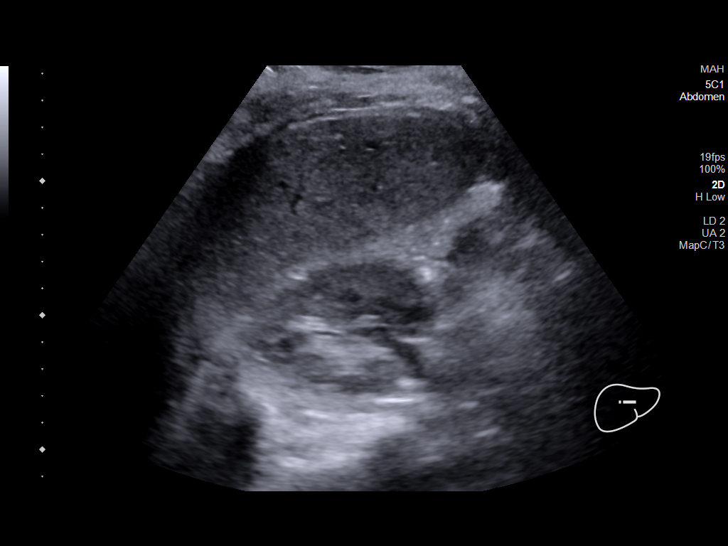
[im 25/34]
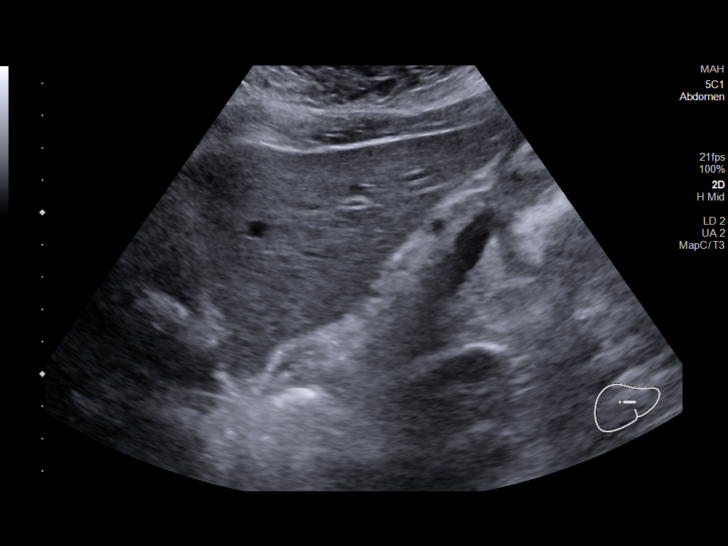
[im 28/34]
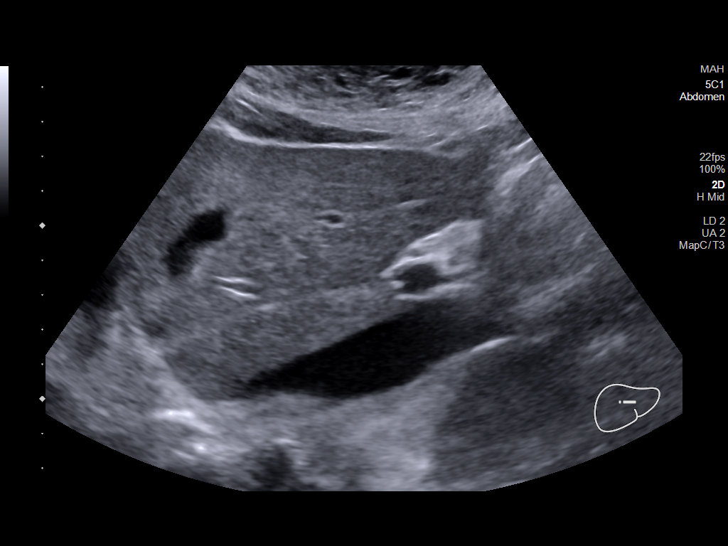
[im 31/34]
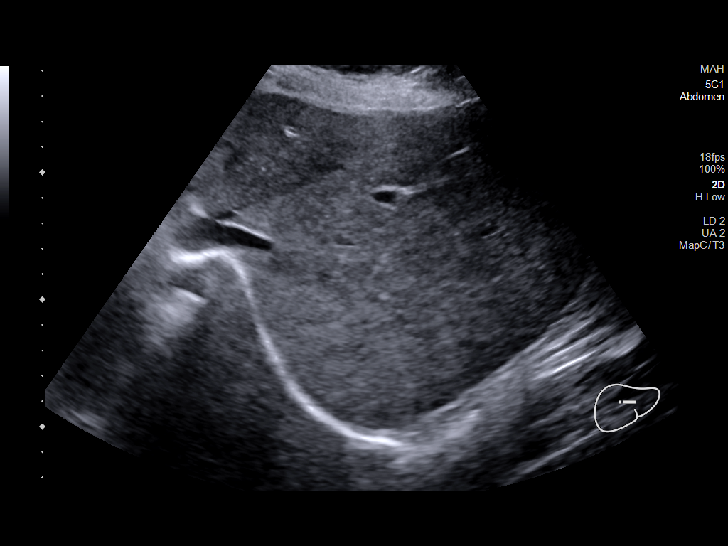
[im 34/34]
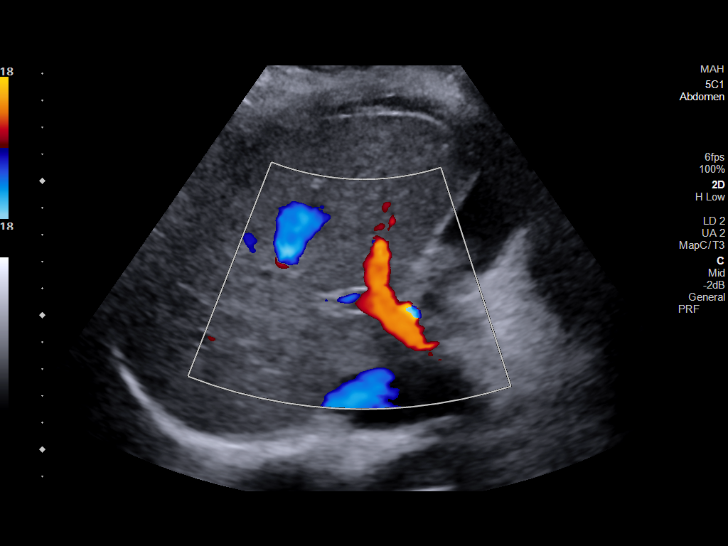

[14 of 25 positions shown; findings below may reference images not displayed]

FINDINGS: Gallbladder:

No gallstones or wall thickening visualized. No sonographic Murphy
sign noted by sonographer.

Common bile duct:

Diameter: 0.5 cm, within normal limits

Liver:

No focal lesion identified. Within normal limits in parenchymal
echogenicity. Portal vein is patent on color Doppler imaging with
normal direction of blood flow towards the liver.

Other: None.
IMPRESSION: No finding sonographically in the right upper quadrant to explain
the patient's abdominal pain.
